# Patient Record
Sex: Female | Born: 1975 | Race: White | Hispanic: No | Smoking: Current every day smoker
Health system: Southern US, Community
[De-identification: ages and names within clinical notes are randomized; demographics above are authoritative.]

## PROBLEM LIST (undated history)

## (undated) DIAGNOSIS — M5430 Sciatica, unspecified side: Secondary | ICD-10-CM

## (undated) DIAGNOSIS — F32A Depression, unspecified: Secondary | ICD-10-CM

## (undated) DIAGNOSIS — M79604 Pain in right leg: Secondary | ICD-10-CM

## (undated) DIAGNOSIS — R350 Frequency of micturition: Secondary | ICD-10-CM

## (undated) DIAGNOSIS — N3941 Urge incontinence: Secondary | ICD-10-CM

## (undated) DIAGNOSIS — F329 Major depressive disorder, single episode, unspecified: Secondary | ICD-10-CM

## (undated) DIAGNOSIS — J353 Hypertrophy of tonsils with hypertrophy of adenoids: Secondary | ICD-10-CM

## (undated) DIAGNOSIS — G43909 Migraine, unspecified, not intractable, without status migrainosus: Secondary | ICD-10-CM

## (undated) DIAGNOSIS — J45909 Unspecified asthma, uncomplicated: Secondary | ICD-10-CM

## (undated) DIAGNOSIS — G709 Myoneural disorder, unspecified: Secondary | ICD-10-CM

## (undated) DIAGNOSIS — N3010 Interstitial cystitis (chronic) without hematuria: Secondary | ICD-10-CM

## (undated) DIAGNOSIS — Z87820 Personal history of traumatic brain injury: Secondary | ICD-10-CM

## (undated) DIAGNOSIS — G8929 Other chronic pain: Secondary | ICD-10-CM

## (undated) DIAGNOSIS — K219 Gastro-esophageal reflux disease without esophagitis: Secondary | ICD-10-CM

## (undated) DIAGNOSIS — R319 Hematuria, unspecified: Secondary | ICD-10-CM

## (undated) DIAGNOSIS — F419 Anxiety disorder, unspecified: Secondary | ICD-10-CM

## (undated) HISTORY — DX: Sciatica, unspecified side: M54.30

## (undated) HISTORY — DX: Interstitial cystitis (chronic) without hematuria: N30.10

## (undated) HISTORY — DX: Pain in right leg: M79.604

## (undated) HISTORY — PX: TUBAL LIGATION: SHX77

---

## 2004-06-19 HISTORY — PX: LAPAROSCOPIC CHOLECYSTECTOMY: SUR755

## 2006-11-18 HISTORY — PX: VAGINAL HYSTERECTOMY: SUR661

## 2007-04-20 HISTORY — PX: INTERSTIM IMPLANT PLACEMENT: SHX5130

## 2011-10-31 ENCOUNTER — Ambulatory Visit (INDEPENDENT_AMBULATORY_CARE_PROVIDER_SITE_OTHER): Payer: Medicaid Other | Admitting: Urology

## 2011-10-31 DIAGNOSIS — N3010 Interstitial cystitis (chronic) without hematuria: Secondary | ICD-10-CM

## 2011-10-31 DIAGNOSIS — R350 Frequency of micturition: Secondary | ICD-10-CM

## 2011-10-31 DIAGNOSIS — N949 Unspecified condition associated with female genital organs and menstrual cycle: Secondary | ICD-10-CM

## 2012-09-30 HISTORY — PX: OTHER SURGICAL HISTORY: SHX169

## 2012-12-02 ENCOUNTER — Other Ambulatory Visit: Payer: Self-pay | Admitting: Urology

## 2013-01-14 ENCOUNTER — Encounter (HOSPITAL_BASED_OUTPATIENT_CLINIC_OR_DEPARTMENT_OTHER): Payer: Self-pay | Admitting: *Deleted

## 2013-01-15 ENCOUNTER — Encounter (HOSPITAL_BASED_OUTPATIENT_CLINIC_OR_DEPARTMENT_OTHER): Payer: Self-pay | Admitting: *Deleted

## 2013-01-15 NOTE — Progress Notes (Signed)
NPO AFTER MN. ARRIVES AT 0800. NEEDS HG. WILL TAKE TOPAMAX AND PREVACID AM OF SURG W/ SIP OF WATER.

## 2013-01-20 NOTE — H&P (Signed)
History of Present Illness   [name] had an Interstim approximately 6 years ago in [place]. She used to get a lot of suprapubic burning and frequency. When [place] trouble shot the device in July 2013, she was much better. VESIcare had almost stopped her from voiding at one point in time. When [place] saw her last year, she had 1 or 2 years of battery life left.   In the last month, she started having a lot of burning in the suprapubic area. It is refractory to over-the-counter medications. She said she feels ____ in her bladder. It is not relieved by voiding. She does not report urinary tract infection, kidney stones, or hematuria.  There is no other modifying factors or associated signs or symptoms. There is no other aggravating or relieving factors. The symptoms are moderate in severity and persisting.   On physical examination, she is quite obese. I can palpate the Interstim II in the right high buttock area.  Review of Systems: No change in bowel or neurologic systems.   She says this is exactly the pain when her device does not work. She says she no longer can feel it in the vaginal area when she turns it up.    Past Medical History Problems  1. History of  Anxiety (Symptom) 300.00 2. History of  Asthma 493.90 3. History of  Chronic Reflux Esophagitis 530.11 4. History of  Cystitis 595.9 5. History of  Depression 311  Surgical History Problems  1. History of  Cholecystectomy Laparoscopic 2. History of  Electronic Bladder Stimulator 3. History of  Hysterectomy V45.77 4. History of  Tubal Ligation V25.2  Current Meds 1. Hydrocodone-Acetaminophen TABS; Therapy: (Recorded:13Jun2014) to 2. Omeprazole 40 MG Oral Capsule Delayed Release; Therapy: (Recorded:14May2013) to 3. Topiramate TABS; Therapy: (Recorded:14May2013) to 4. Zoloft TABS; Therapy: (Recorded:13Jun2014) to  Allergies Medication  1. Keflex CAPS 2. PROzac CAPS  Family History Problems  1. Family history of   Diabetes Mellitus V18.0 2. Family history of  Endometriosis 3. Family history of  Family Health Status Number Of Children 2 sons 4. Family history of  Nephrolithiasis 5. Family history of  Prostate Cancer V16.42  Social History Problems  1. Former Smoker V15.82 Smoked 1 pk p/d for 21 yrs 2. Marital History - Divorced V61.03 3. Never Drank Alcohol 4. Occupation: Lawyer Vital Signs [Data Includes: Last 1 Day]  13Jun2014 10:11AM  Blood Pressure: 158 / 123 Temperature: 98.4 F Heart Rate: 70  Results/Data Urine [Data Includes: Last 1 Day]   13Jun2014  COLOR ORANGE   APPEARANCE CLOUDY   SPECIFIC GRAVITY 1.020   pH 5.5   GLUCOSE 100 mg/dL  BILIRUBIN NEG   KETONE NEG mg/dL  BLOOD NEG   PROTEIN TRACE mg/dL  UROBILINOGEN 1 mg/dL  NITRITE POS   LEUKOCYTE ESTERASE NEG   SQUAMOUS EPITHELIAL/HPF FEW   WBC 0-2 WBC/hpf  RBC 0-2 RBC/hpf  BACTERIA FEW   CRYSTALS NONE SEEN   CASTS NONE SEEN   Other MUCUS NOTED    Assessment Assessed  1. 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 Abdominal Pain 789.00  Plan Health Maintenance (V70.0)  1. UA With REFLEX  Done: 13Jun2014 10:03AM  Discussion/Summary   I discussed with Pamela Leonard that the likelihood of the battery life from her Interstim is gone. I discussed replacing it and hopefully in the same position. I talked about retained fragments. I talked about the fact that in spite of replacement, we may not reach her treatment goal and she  may have ongoing discomfort. I will have Pamela Leonard trouble shoot to make certain that the battery has ran out. As long as the impedance, etc, is good, intraoperatively I do not think her lead would need to be changed. I will discuss this with [facility].  Pros, cons, success and failure rates of Interstim were discussed. We talked about the test stimulation (office/operating room) and the second stage procedure. Risks were described but not limited to the risk of persistent, de novo, or worsening  incontinence. Risks of pain, bleeding, infection, and neuropathy were discussed. Risk of malfunction, migration, and breakage were discussed. Trouble-shooting, battery life, and the need for explanation and reoperation were discussed. MRI issues were discussed. The patient understands that she might not reach her treatment goal and that she might be worse following surgery.   I thought it was reasonable for Pamela Leonard to have her Vicodin renewed at the higher strength that she is on. She will get Vancomycin preoperatively. I will have Pamela Leonard troubleshoot the day of surgery or meet her in clinic prior.  After a thorough review of the management options for the patient's condition the patient  elected to proceed with surgical therapy as noted above. We have discussed the potential benefits and risks of the procedure, side effects of the proposed treatment, the likelihood of the patient achieving the goals of the procedure, and any potential problems that might occur during the procedure or recuperation. Informed consent has been obtained.

## 2013-01-21 ENCOUNTER — Ambulatory Visit (HOSPITAL_BASED_OUTPATIENT_CLINIC_OR_DEPARTMENT_OTHER): Payer: Medicaid Other | Admitting: Anesthesiology

## 2013-01-21 ENCOUNTER — Encounter (HOSPITAL_BASED_OUTPATIENT_CLINIC_OR_DEPARTMENT_OTHER)
Admission: RE | Disposition: A | Discharge status: Home / Self Care | Payer: Self-pay | Source: Ambulatory Visit | Attending: Urology

## 2013-01-21 ENCOUNTER — Ambulatory Visit (HOSPITAL_COMMUNITY): Payer: Medicaid Other

## 2013-01-21 ENCOUNTER — Encounter (HOSPITAL_BASED_OUTPATIENT_CLINIC_OR_DEPARTMENT_OTHER): Payer: Self-pay | Admitting: Anesthesiology

## 2013-01-21 ENCOUNTER — Ambulatory Visit (HOSPITAL_BASED_OUTPATIENT_CLINIC_OR_DEPARTMENT_OTHER)
Admission: RE | Admit: 2013-01-21 | Discharge: 2013-01-21 | Disposition: A | Discharge status: Home / Self Care | Payer: Medicaid Other | Source: Ambulatory Visit | Attending: Urology | Admitting: Urology

## 2013-01-21 ENCOUNTER — Encounter (HOSPITAL_BASED_OUTPATIENT_CLINIC_OR_DEPARTMENT_OTHER): Payer: Self-pay | Admitting: *Deleted

## 2013-01-21 DIAGNOSIS — E669 Obesity, unspecified: Secondary | ICD-10-CM | POA: Insufficient documentation

## 2013-01-21 DIAGNOSIS — N3010 Interstitial cystitis (chronic) without hematuria: Secondary | ICD-10-CM | POA: Insufficient documentation

## 2013-01-21 DIAGNOSIS — K219 Gastro-esophageal reflux disease without esophagitis: Secondary | ICD-10-CM | POA: Insufficient documentation

## 2013-01-21 DIAGNOSIS — T85695A Other mechanical complication of other nervous system device, implant or graft, initial encounter: Secondary | ICD-10-CM | POA: Insufficient documentation

## 2013-01-21 DIAGNOSIS — Y831 Surgical operation with implant of artificial internal device as the cause of abnormal reaction of the patient, or of later complication, without mention of misadventure at the time of the procedure: Secondary | ICD-10-CM | POA: Insufficient documentation

## 2013-01-21 DIAGNOSIS — R109 Unspecified abdominal pain: Secondary | ICD-10-CM | POA: Insufficient documentation

## 2013-01-21 DIAGNOSIS — Z79899 Other long term (current) drug therapy: Secondary | ICD-10-CM | POA: Insufficient documentation

## 2013-01-21 HISTORY — DX: Urge incontinence: N39.41

## 2013-01-21 HISTORY — PX: INTERSTIM IMPLANT REVISION: SHX5138

## 2013-01-21 HISTORY — DX: Gastro-esophageal reflux disease without esophagitis: K21.9

## 2013-01-21 HISTORY — DX: Frequency of micturition: R35.0

## 2013-01-21 HISTORY — DX: Personal history of traumatic brain injury: Z87.820

## 2013-01-21 HISTORY — DX: Hematuria, unspecified: R31.9

## 2013-01-21 HISTORY — DX: Other chronic pain: G89.29

## 2013-01-21 HISTORY — DX: Migraine, unspecified, not intractable, without status migrainosus: G43.909

## 2013-01-21 LAB — POCT HEMOGLOBIN-HEMACUE: Hemoglobin: 12.4 g/dL (ref 12.0–15.0)

## 2013-01-21 SURGERY — REVISION, SACRAL NERVE STIMULATOR, INTERSTIM
Anesthesia: Monitor Anesthesia Care | Site: Back | Laterality: Right | Wound class: Clean

## 2013-01-21 MED ORDER — PROMETHAZINE HCL 25 MG/ML IJ SOLN
6.2500 mg | INTRAMUSCULAR | Status: DC | PRN
  Filled 2013-01-21: qty 1

## 2013-01-21 MED ORDER — LIDOCAINE-EPINEPHRINE (PF) 1 %-1:200000 IJ SOLN
INTRAMUSCULAR | Status: DC | PRN
  Administered 2013-01-21: 24 mL

## 2013-01-21 MED ORDER — BUPIVACAINE-EPINEPHRINE 0.5% -1:200000 IJ SOLN
INTRAMUSCULAR | Status: DC | PRN
  Administered 2013-01-21: 24 mL

## 2013-01-21 MED ORDER — PROPOFOL 10 MG/ML IV BOLUS
INTRAVENOUS | Status: DC | PRN
  Administered 2013-01-21: 20 mg via INTRAVENOUS
  Administered 2013-01-21: 10 mg via INTRAVENOUS

## 2013-01-21 MED ORDER — VANCOMYCIN HCL IN DEXTROSE 1-5 GM/200ML-% IV SOLN
1000.0000 mg | INTRAVENOUS | Status: AC
  Administered 2013-01-21: 1000 mg via INTRAVENOUS
  Filled 2013-01-21: qty 200

## 2013-01-21 MED ORDER — KETOROLAC TROMETHAMINE 30 MG/ML IJ SOLN
INTRAMUSCULAR | Status: DC | PRN
  Administered 2013-01-21: 30 mg via INTRAVENOUS

## 2013-01-21 MED ORDER — MIDAZOLAM HCL 5 MG/5ML IJ SOLN
INTRAMUSCULAR | Status: DC | PRN
  Administered 2013-01-21: 1 mg via INTRAVENOUS
  Administered 2013-01-21: 2 mg via INTRAVENOUS
  Administered 2013-01-21: 1 mg via INTRAVENOUS

## 2013-01-21 MED ORDER — LACTATED RINGERS IV SOLN
INTRAVENOUS | Status: DC
  Filled 2013-01-21: qty 1000

## 2013-01-21 MED ORDER — LACTATED RINGERS IV SOLN
INTRAVENOUS | Status: DC | PRN
  Administered 2013-01-21 (×2): via INTRAVENOUS

## 2013-01-21 MED ORDER — LACTATED RINGERS IV SOLN
INTRAVENOUS | Status: DC
  Administered 2013-01-21: 09:00:00 via INTRAVENOUS
  Filled 2013-01-21: qty 1000

## 2013-01-21 MED ORDER — PROPOFOL 10 MG/ML IV EMUL
INTRAVENOUS | Status: DC | PRN
  Administered 2013-01-21: 50 ug/kg/min via INTRAVENOUS

## 2013-01-21 MED ORDER — HYDROCODONE-ACETAMINOPHEN 7.5-325 MG PO TABS
1.0000 | ORAL_TABLET | Freq: Four times a day (QID) | ORAL | Status: DC | PRN
  Administered 2013-01-21: 1 via ORAL
  Filled 2013-01-21: qty 1

## 2013-01-21 MED ORDER — DEXAMETHASONE SODIUM PHOSPHATE 4 MG/ML IJ SOLN
INTRAMUSCULAR | Status: DC | PRN
  Administered 2013-01-21: 4 mg via INTRAVENOUS

## 2013-01-21 MED ORDER — HYDROCODONE-ACETAMINOPHEN 7.5-325 MG PO TABS: 1.0000 | ORAL_TABLET | Freq: Four times a day (QID) | ORAL | Status: DC | PRN

## 2013-01-21 MED ORDER — FENTANYL CITRATE 0.05 MG/ML IJ SOLN
INTRAMUSCULAR | Status: DC | PRN
  Administered 2013-01-21: 25 ug via INTRAVENOUS
  Administered 2013-01-21 (×2): 12.5 ug via INTRAVENOUS
  Administered 2013-01-21: 25 ug via INTRAVENOUS
  Administered 2013-01-21 (×2): 12.5 ug via INTRAVENOUS

## 2013-01-21 MED ORDER — ONDANSETRON HCL 4 MG/2ML IJ SOLN
INTRAMUSCULAR | Status: DC | PRN
  Administered 2013-01-21: 4 mg via INTRAVENOUS

## 2013-01-21 MED ORDER — FENTANYL CITRATE 0.05 MG/ML IJ SOLN
25.0000 ug | INTRAMUSCULAR | Status: DC | PRN
  Filled 2013-01-21: qty 1

## 2013-01-21 MED ORDER — KETAMINE HCL 10 MG/ML IJ SOLN
INTRAMUSCULAR | Status: DC | PRN
  Administered 2013-01-21 (×3): 20 mg via INTRAVENOUS
  Administered 2013-01-21: 10 mg via INTRAVENOUS
  Administered 2013-01-21: 20 mg via INTRAVENOUS
  Administered 2013-01-21: 10 mg via INTRAVENOUS
  Administered 2013-01-21 (×2): 20 mg via INTRAVENOUS
  Administered 2013-01-21: 10 mg via INTRAVENOUS

## 2013-01-21 SURGICAL SUPPLY — 60 items
BAG URINE DRAINAGE (UROLOGICAL SUPPLIES) ×3 IMPLANT
BAG URINE LEG 500ML (DRAIN) IMPLANT
BANDAGE ADHESIVE 1X3 (GAUZE/BANDAGES/DRESSINGS) IMPLANT
BENZOIN TINCTURE PRP APPL 2/3 (GAUZE/BANDAGES/DRESSINGS) ×3 IMPLANT
BLADE HEX COATED 2.75 (ELECTRODE) ×3 IMPLANT
BLADE SURG 15 STRL LF DISP TIS (BLADE) ×2 IMPLANT
BLADE SURG 15 STRL SS (BLADE) ×1
CATH FOLEY 2WAY  5CC 16FR SIL (CATHETERS) ×1
CATH FOLEY 2WAY 5CC 16FR SIL (CATHETERS) ×2 IMPLANT
CATH FOLEY 2WAY SLVR  5CC 16FR (CATHETERS)
CATH FOLEY 2WAY SLVR 5CC 16FR (CATHETERS) IMPLANT
CLOTH BEACON ORANGE TIMEOUT ST (SAFETY) ×3 IMPLANT
COVER LIGHT HANDLE  1/PK (MISCELLANEOUS) ×1
COVER LIGHT HANDLE 1/PK (MISCELLANEOUS) ×2 IMPLANT
COVER MAYO STAND STRL (DRAPES) ×3 IMPLANT
COVER PROBE W GEL 5X96 (DRAPES) ×3 IMPLANT
COVER TABLE BACK 60X90 (DRAPES) ×3 IMPLANT
DERMABOND ADVANCED (GAUZE/BANDAGES/DRESSINGS) ×2
DERMABOND ADVANCED .7 DNX12 (GAUZE/BANDAGES/DRESSINGS) ×4 IMPLANT
DRAPE C-ARM 42X72 X-RAY (DRAPES) ×3 IMPLANT
DRAPE INCISE 23X17 IOBAN STRL (DRAPES) ×1
DRAPE INCISE IOBAN 23X17 STRL (DRAPES) ×2 IMPLANT
DRAPE LAPAROSCOPIC ABDOMINAL (DRAPES) ×3 IMPLANT
DRAPE LG THREE QUARTER DISP (DRAPES) ×3 IMPLANT
DRESSING TELFA 8X3 (GAUZE/BANDAGES/DRESSINGS) ×3 IMPLANT
DRSG TEGADERM 2-3/8X2-3/4 SM (GAUZE/BANDAGES/DRESSINGS) ×3 IMPLANT
DRSG TEGADERM 4X4.75 (GAUZE/BANDAGES/DRESSINGS) ×3 IMPLANT
ELECT REM PT RETURN 9FT ADLT (ELECTROSURGICAL) ×3
ELECTRODE REM PT RTRN 9FT ADLT (ELECTROSURGICAL) ×2 IMPLANT
GAUZE SPONGE 4X4 12PLY STRL LF (GAUZE/BANDAGES/DRESSINGS) IMPLANT
GLOVE BIO SURGEON STRL SZ7.5 (GLOVE) ×3 IMPLANT
GLOVE INDICATOR 6.5 STRL GRN (GLOVE) ×3 IMPLANT
GOWN PREVENTION PLUS LG XLONG (DISPOSABLE) ×3 IMPLANT
GOWN STRL REIN XL XLG (GOWN DISPOSABLE) ×3 IMPLANT
HOLDER FOLEY CATH W/STRAP (MISCELLANEOUS) ×3 IMPLANT
INTRODUCER GUIDE DILATR SHEATH (SET/KITS/TRAYS/PACK) ×3 IMPLANT
NEEDLE FORAMEN 20GA 3.5  9CM (NEEDLE) IMPLANT
NEEDLE FORAMEN 20GA 5  12.5CM (NEEDLE) IMPLANT
NEEDLE HYPO 22GX1.5 SAFETY (NEEDLE) ×3 IMPLANT
NEEDLE SPNL 22GX7 QUINCKE BK (NEEDLE) ×3 IMPLANT
PACK BASIN DAY SURGERY FS (CUSTOM PROCEDURE TRAY) ×3 IMPLANT
PENCIL BUTTON HOLSTER BLD 10FT (ELECTRODE) IMPLANT
PROGRAMMER ANTENNA EXT (UROLOGICAL SUPPLIES) ×3 IMPLANT
PROGRAMMER STIMUL 2.2X1.1X3.7 (UROLOGICAL SUPPLIES) ×3 IMPLANT
STAPLER VISISTAT 35W (STAPLE) IMPLANT
STIMULATOR INTERSTIM 2X1.7X.3 (Orthopedic Implant) ×3 IMPLANT
STRIP CLOSURE SKIN 1/2X4 (GAUZE/BANDAGES/DRESSINGS) ×3 IMPLANT
SUCTION FRAZIER TIP 10 FR DISP (SUCTIONS) ×3 IMPLANT
SUT SILK 2 0 (SUTURE) ×1
SUT SILK 2-0 18XBRD TIE 12 (SUTURE) ×2 IMPLANT
SUT VIC AB 3-0 SH 27 (SUTURE) ×3
SUT VIC AB 3-0 SH 27X BRD (SUTURE) ×6 IMPLANT
SUT VICRYL 4-0 PS2 18IN ABS (SUTURE) ×9 IMPLANT
SYR BULB IRRIGATION 50ML (SYRINGE) ×3 IMPLANT
SYR CONTROL 10ML LL (SYRINGE) ×3 IMPLANT
SYRINGE 10CC LL (SYRINGE) ×3 IMPLANT
TOWEL OR 17X24 6PK STRL BLUE (TOWEL DISPOSABLE) ×6 IMPLANT
TRAY DSU PREP LF (CUSTOM PROCEDURE TRAY) ×3 IMPLANT
WATER STERILE IRR 500ML POUR (IV SOLUTION) ×3 IMPLANT
YANKAUER SUCT BULB TIP NO VENT (SUCTIONS) ×3 IMPLANT
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
## 2013-01-21 NOTE — Anesthesia Procedure Notes (Signed)
Procedure Name: MAC Date/Time: 01/21/2013 9:50 AM Performed by: Jessica Priest Pre-anesthesia Checklist: Patient identified, Emergency Drugs available, Suction available, Patient being monitored and Timeout performed Oxygen Delivery Method: Simple face mask Preoxygenation: Pre-oxygenation with 100% oxygen

## 2013-01-21 NOTE — Interval H&P Note (Signed)
History and Physical Interval Note:  01/21/2013 9:16 AM  Pamela Leonard  has presented today for surgery, with the diagnosis of REFRACTORY  URGENCY INCONTINENT FREQUENCY  The various methods of treatment have been discussed with the patient and family. After consideration of risks, benefits and other options for treatment, the patient has consented to  Procedure(s): INTERSTIM IMPLANT FIRST STAGE (N/A) INTERSTIM IMPLANT SECOND STAGE (N/A) as a surgical intervention .  The patient's history has been reviewed, patient examined, no change in status, stable for surgery.  I have reviewed the patient's chart and labs.  Questions were answered to the patient's satisfaction.     Keylor Rands A

## 2013-01-21 NOTE — Transfer of Care (Signed)
Immediate Anesthesia Transfer of Care Note  Patient: Pamela Leonard  Procedure(s) Performed: Procedure(s) (LRB):  replacement of neurostimulator and revision of neurostimulator electrode (Right)  Patient Location: PACU  Anesthesia Type: MAC  Level of Consciousness: awake, alert , oriented and patient cooperative  Airway & Oxygen Therapy: Patient Spontanous Breathing and Patient connected to face mask oxygen  Post-op Assessment: Report given to PACU RN and Post -op Vital signs reviewed and stable  Post vital signs: Reviewed and stable  Complications: No apparent anesthesia complications

## 2013-01-21 NOTE — Anesthesia Postprocedure Evaluation (Signed)
Anesthesia Post Note  Patient: Pamela Leonard  Procedure(s) Performed: Procedure(s) (LRB):  replacement of neurostimulator and revision of neurostimulator electrode (Right)  Anesthesia type: MAC  Patient location: PACU  Post pain: Pain level controlled  Post assessment: Post-op Vital signs reviewed  Last Vitals:  Filed Vitals:   01/21/13 1145  BP: 107/54  Pulse: 59  Temp:   Resp: 13    Post vital signs: Reviewed  Level of consciousness: sedated  Complications: No apparent anesthesia complications

## 2013-01-21 NOTE — Op Note (Signed)
Preoperative diagnosis: Malfunctioning InterStim Postoperative diagnosis: Malfunctioning InterStim Surgery: Replacement of generator; revision of lead; impedance check and fluoroscopy Surgeon: Dr. Alfredo Martinez  The patient has the above diagnoses and consented above procedure. Her frequency and pelvic pain has been severe getting worse over time. She describes small-volume frequency. She's had a lot of pelvic pain that somewhat diffuse. Severity is moderate to severe. She was given preoperative antibiotics of vancomycin based on allergies. Skin preparation was done as usual.  She is moderately obese but her generator was very lateral. It was somewhat difficult to get to see a reach that far ashy see a. She also had a loop near the usual bend medially.  I instilled 10 cc of a lidocaine epinephrine mixture. I had marked the right lateral incision. I carried the incision down with cutting current and scalpel to enter the pseudocapsule. The generator was easily delivered. It was disconnected intact with a screwdriver.  Using fluoroscopically in the AP position the lead looked excellent through S3 with the appropriate curvature.  All 4 leads Viviann Spare excellent toe. Position 0 was between 2 and 4 and position one 2 and 3 were is low as 1. It was felt that the position of the lead could not be improved upon. I had consented the patient properly that the lead might be removed which could give equal worse or better symptom control. I truly felt that I could not better the position of today's lead. The pre-surgery impedance checks were also normal and she was feeling the stimulation in the vagina.  Recognizing it would need a second incision I felt it was prudent to eventually place the generator more medial. It was very lateral relative to the bony landmarks. I was hopeful that this may help her pain syndrome. I also hopefully wanedet to straighten the loop medially perhaps make any difference with her pain  syndrome. In order to do this I had to open the incision medially to deliver the lead to the new medial generator incision.  I marked the position medially with fluoroscopy and I could see skin dimpling with cutting on the lead. I initially made approximately 2 cm incision but had to be lengthened a 3 or 4 cm because the lead was very deep. It turns out to the lead was at least an inch below the skin. It took approximate 20-30 minutes to finally deliver it safely without dislodging it and pulling it from medial to lateral. It was tested again and all 4 positions were good  I then marked an appropriate medial high right upper buttock incision 4 cm in length. Lidocaine epinephrine mixture was utilized. I dissected down forming a pocket approximately 1-2 cm in depth. I could finger dissect between the medial and lateral incision and using a modified passer passed the lead from medial to lateral. It was then connected it with a screwdriver to the IPG  Impedance check was performed. Impedance was normal with all 4 positions  X-ray was utilized again and is very pleased with the position of the lead and position of the generator.  All incisions were irrigated with sterile water. All incisions were closed with 3-0 subcuticular Vicryl area to lateral incisions were closed with 4-0 subcuticular and medial with interrupted 4-0 Vicryl. My usual sterile dressing the Dermabond was applied.  I was very pleased with the position of the generator and lead at the end of the case. The [employer] representative thought he could work with that postoperatively well hopefully to improve  her frequency. She's had improvement in pain in the past though was not placed for this.  Preoperatively the patient understood that her initial cystitis condition could have worsened for unknown reasons.  The patient will be followed as per protocol

## 2013-01-22 ENCOUNTER — Encounter (HOSPITAL_BASED_OUTPATIENT_CLINIC_OR_DEPARTMENT_OTHER): Payer: Self-pay | Admitting: Urology

## 2015-06-16 NOTE — Progress Notes (Unsigned)
B/P: 103/74, Pulse: 60, Taken by Samule Dry, RN

## 2015-12-02 ENCOUNTER — Ambulatory Visit (INDEPENDENT_AMBULATORY_CARE_PROVIDER_SITE_OTHER): Payer: Medicaid Other | Admitting: Otolaryngology

## 2016-01-06 ENCOUNTER — Ambulatory Visit (INDEPENDENT_AMBULATORY_CARE_PROVIDER_SITE_OTHER): Payer: BLUE CROSS/BLUE SHIELD | Admitting: Otolaryngology

## 2016-01-06 ENCOUNTER — Other Ambulatory Visit: Payer: Self-pay | Admitting: Otolaryngology

## 2016-01-06 DIAGNOSIS — J3501 Chronic tonsillitis: Secondary | ICD-10-CM | POA: Diagnosis not present

## 2016-01-06 DIAGNOSIS — J351 Hypertrophy of tonsils: Secondary | ICD-10-CM

## 2016-01-07 ENCOUNTER — Encounter (HOSPITAL_BASED_OUTPATIENT_CLINIC_OR_DEPARTMENT_OTHER): Payer: Self-pay | Admitting: *Deleted

## 2016-01-07 NOTE — Progress Notes (Signed)
Pre-op call completed, Patient BMI 46.1 with stated not actual weight. Will need anesthesia consult DOS.

## 2016-01-10 ENCOUNTER — Ambulatory Visit (HOSPITAL_BASED_OUTPATIENT_CLINIC_OR_DEPARTMENT_OTHER): Payer: BLUE CROSS/BLUE SHIELD | Admitting: Certified Registered"

## 2016-01-10 ENCOUNTER — Encounter (HOSPITAL_BASED_OUTPATIENT_CLINIC_OR_DEPARTMENT_OTHER): Payer: Self-pay

## 2016-01-10 ENCOUNTER — Encounter (HOSPITAL_BASED_OUTPATIENT_CLINIC_OR_DEPARTMENT_OTHER)
Admission: RE | Disposition: A | Discharge status: Home / Self Care | Payer: Self-pay | Source: Ambulatory Visit | Attending: Otolaryngology

## 2016-01-10 ENCOUNTER — Ambulatory Visit (HOSPITAL_BASED_OUTPATIENT_CLINIC_OR_DEPARTMENT_OTHER)
Admission: RE | Admit: 2016-01-10 | Discharge: 2016-01-10 | Disposition: A | Discharge status: Home / Self Care | Payer: BLUE CROSS/BLUE SHIELD | Source: Ambulatory Visit | Attending: Otolaryngology | Admitting: Otolaryngology

## 2016-01-10 DIAGNOSIS — J353 Hypertrophy of tonsils with hypertrophy of adenoids: Secondary | ICD-10-CM | POA: Diagnosis not present

## 2016-01-10 DIAGNOSIS — J351 Hypertrophy of tonsils: Secondary | ICD-10-CM | POA: Diagnosis not present

## 2016-01-10 DIAGNOSIS — Z90710 Acquired absence of both cervix and uterus: Secondary | ICD-10-CM | POA: Diagnosis not present

## 2016-01-10 DIAGNOSIS — Z6841 Body Mass Index (BMI) 40.0 and over, adult: Secondary | ICD-10-CM | POA: Insufficient documentation

## 2016-01-10 DIAGNOSIS — Z9851 Tubal ligation status: Secondary | ICD-10-CM | POA: Diagnosis not present

## 2016-01-10 DIAGNOSIS — J45909 Unspecified asthma, uncomplicated: Secondary | ICD-10-CM | POA: Diagnosis not present

## 2016-01-10 DIAGNOSIS — J3501 Chronic tonsillitis: Secondary | ICD-10-CM | POA: Diagnosis present

## 2016-01-10 DIAGNOSIS — K219 Gastro-esophageal reflux disease without esophagitis: Secondary | ICD-10-CM | POA: Insufficient documentation

## 2016-01-10 DIAGNOSIS — F17210 Nicotine dependence, cigarettes, uncomplicated: Secondary | ICD-10-CM | POA: Insufficient documentation

## 2016-01-10 DIAGNOSIS — J3503 Chronic tonsillitis and adenoiditis: Secondary | ICD-10-CM | POA: Diagnosis not present

## 2016-01-10 HISTORY — DX: Hypertrophy of tonsils with hypertrophy of adenoids: J35.3

## 2016-01-10 HISTORY — DX: Myoneural disorder, unspecified: G70.9

## 2016-01-10 HISTORY — DX: Major depressive disorder, single episode, unspecified: F32.9

## 2016-01-10 HISTORY — DX: Depression, unspecified: F32.A

## 2016-01-10 HISTORY — DX: Interstitial cystitis (chronic) without hematuria: N30.10

## 2016-01-10 HISTORY — DX: Anxiety disorder, unspecified: F41.9

## 2016-01-10 HISTORY — DX: Unspecified asthma, uncomplicated: J45.909

## 2016-01-10 HISTORY — PX: TONSILLECTOMY AND ADENOIDECTOMY: SHX28

## 2016-01-10 SURGERY — TONSILLECTOMY AND ADENOIDECTOMY
Anesthesia: General | Site: Throat | Laterality: Bilateral

## 2016-01-10 MED ORDER — FENTANYL CITRATE (PF) 100 MCG/2ML IJ SOLN
INTRAMUSCULAR | Status: AC
  Filled 2016-01-10: qty 2

## 2016-01-10 MED ORDER — HYDROMORPHONE HCL 1 MG/ML IJ SOLN
0.2500 mg | INTRAMUSCULAR | Status: DC | PRN
  Administered 2016-01-10 (×4): 0.5 mg via INTRAVENOUS

## 2016-01-10 MED ORDER — OXYMETAZOLINE HCL 0.05 % NA SOLN
NASAL | Status: DC | PRN
Start: 2016-01-10 — End: 2016-01-10
  Administered 2016-01-10: 1 via TOPICAL

## 2016-01-10 MED ORDER — SCOPOLAMINE 1 MG/3DAYS TD PT72: 1.0000 | MEDICATED_PATCH | Freq: Once | TRANSDERMAL | Status: DC | PRN

## 2016-01-10 MED ORDER — LIDOCAINE 2% (20 MG/ML) 5 ML SYRINGE
INTRAMUSCULAR | Status: DC | PRN
  Administered 2016-01-10: 100 mg via INTRAVENOUS

## 2016-01-10 MED ORDER — OXYCODONE HCL 5 MG/5ML PO SOLN: 5.0000 mg | ORAL | 0 refills | Status: DC | PRN

## 2016-01-10 MED ORDER — OXYCODONE HCL 5 MG/5ML PO SOLN: 5.0000 mg | Freq: Once | ORAL | Status: AC | PRN

## 2016-01-10 MED ORDER — DEXAMETHASONE SODIUM PHOSPHATE 10 MG/ML IJ SOLN
INTRAMUSCULAR | Status: AC
  Filled 2016-01-10: qty 1

## 2016-01-10 MED ORDER — OXYCODONE HCL 5 MG PO TABS
5.0000 mg | ORAL_TABLET | Freq: Once | ORAL | Status: AC | PRN
  Administered 2016-01-10: 5 mg via ORAL

## 2016-01-10 MED ORDER — ONDANSETRON HCL 4 MG/2ML IJ SOLN
INTRAMUSCULAR | Status: DC | PRN
  Administered 2016-01-10: 4 mg via INTRAVENOUS

## 2016-01-10 MED ORDER — PROPOFOL 10 MG/ML IV BOLUS
INTRAVENOUS | Status: DC | PRN
  Administered 2016-01-10: 300 mg via INTRAVENOUS

## 2016-01-10 MED ORDER — GLYCOPYRROLATE 0.2 MG/ML IJ SOLN: 0.2000 mg | Freq: Once | INTRAMUSCULAR | Status: DC | PRN

## 2016-01-10 MED ORDER — GLYCOPYRROLATE 0.2 MG/ML IV SOSY
PREFILLED_SYRINGE | INTRAVENOUS | Status: DC | PRN
  Administered 2016-01-10: .2 mg via INTRAVENOUS

## 2016-01-10 MED ORDER — DEXAMETHASONE SODIUM PHOSPHATE 4 MG/ML IJ SOLN
INTRAMUSCULAR | Status: DC | PRN
  Administered 2016-01-10: 10 mg via INTRAVENOUS

## 2016-01-10 MED ORDER — MIDAZOLAM HCL 2 MG/2ML IJ SOLN
1.0000 mg | INTRAMUSCULAR | Status: DC | PRN
  Administered 2016-01-10: 2 mg via INTRAVENOUS

## 2016-01-10 MED ORDER — SODIUM CHLORIDE 0.9 % IR SOLN
Status: DC | PRN
  Administered 2016-01-10: 500 mL

## 2016-01-10 MED ORDER — OXYCODONE HCL 5 MG PO TABS
ORAL_TABLET | ORAL | Status: AC
  Filled 2016-01-10: qty 1

## 2016-01-10 MED ORDER — LACTATED RINGERS IV SOLN
INTRAVENOUS | Status: DC
  Administered 2016-01-10 (×2): via INTRAVENOUS

## 2016-01-10 MED ORDER — FENTANYL CITRATE (PF) 100 MCG/2ML IJ SOLN
50.0000 ug | INTRAMUSCULAR | Status: AC | PRN
  Administered 2016-01-10: 50 ug via INTRAVENOUS
  Administered 2016-01-10: 100 ug via INTRAVENOUS
  Administered 2016-01-10: 50 ug via INTRAVENOUS

## 2016-01-10 MED ORDER — ONDANSETRON HCL 4 MG/2ML IJ SOLN
INTRAMUSCULAR | Status: AC
  Filled 2016-01-10: qty 2

## 2016-01-10 MED ORDER — MIDAZOLAM HCL 2 MG/2ML IJ SOLN
INTRAMUSCULAR | Status: AC
  Filled 2016-01-10: qty 2

## 2016-01-10 MED ORDER — MEPERIDINE HCL 25 MG/ML IJ SOLN: 6.2500 mg | INTRAMUSCULAR | Status: DC | PRN

## 2016-01-10 MED ORDER — HYDROMORPHONE HCL 1 MG/ML IJ SOLN
INTRAMUSCULAR | Status: AC
  Filled 2016-01-10: qty 1

## 2016-01-10 MED ORDER — LIDOCAINE 2% (20 MG/ML) 5 ML SYRINGE
INTRAMUSCULAR | Status: AC
  Filled 2016-01-10: qty 5

## 2016-01-10 MED ORDER — SUCCINYLCHOLINE CHLORIDE 200 MG/10ML IV SOSY
PREFILLED_SYRINGE | INTRAVENOUS | Status: DC | PRN
  Administered 2016-01-10: 160 mg via INTRAVENOUS

## 2016-01-10 SURGICAL SUPPLY — 31 items
BANDAGE COBAN STERILE 2 (GAUZE/BANDAGES/DRESSINGS) IMPLANT
CANISTER SUCT 1200ML W/VALVE (MISCELLANEOUS) ×3 IMPLANT
CATH ROBINSON RED A/P 10FR (CATHETERS) IMPLANT
CATH ROBINSON RED A/P 14FR (CATHETERS) ×3 IMPLANT
COAGULATOR SUCT 6 FR SWTCH (ELECTROSURGICAL)
COAGULATOR SUCT SWTCH 10FR 6 (ELECTROSURGICAL) IMPLANT
COVER MAYO STAND STRL (DRAPES) ×3 IMPLANT
ELECT REM PT RETURN 9FT ADLT (ELECTROSURGICAL) ×3
ELECT REM PT RETURN 9FT PED (ELECTROSURGICAL)
ELECTRODE REM PT RETRN 9FT PED (ELECTROSURGICAL) IMPLANT
ELECTRODE REM PT RTRN 9FT ADLT (ELECTROSURGICAL) ×1 IMPLANT
GLOVE BIO SURGEON STRL SZ7.5 (GLOVE) ×3 IMPLANT
GLOVE SURG SS PI 7.0 STRL IVOR (GLOVE) ×3 IMPLANT
GOWN STRL REUS W/ TWL LRG LVL3 (GOWN DISPOSABLE) ×2 IMPLANT
GOWN STRL REUS W/TWL LRG LVL3 (GOWN DISPOSABLE) ×4
IV NS 500ML (IV SOLUTION) ×2
IV NS 500ML BAXH (IV SOLUTION) ×1 IMPLANT
MARKER SKIN DUAL TIP RULER LAB (MISCELLANEOUS) IMPLANT
NS IRRIG 1000ML POUR BTL (IV SOLUTION) ×3 IMPLANT
SHEET MEDIUM DRAPE 40X70 STRL (DRAPES) ×3 IMPLANT
SOLUTION BUTLER CLEAR DIP (MISCELLANEOUS) ×3 IMPLANT
SPONGE GAUZE 4X4 12PLY STER LF (GAUZE/BANDAGES/DRESSINGS) ×3 IMPLANT
SPONGE TONSIL 1 RF SGL (DISPOSABLE) IMPLANT
SPONGE TONSIL 1.25 RF SGL STRG (GAUZE/BANDAGES/DRESSINGS) IMPLANT
SYR BULB 3OZ (MISCELLANEOUS) IMPLANT
TOWEL OR 17X24 6PK STRL BLUE (TOWEL DISPOSABLE) ×3 IMPLANT
TUBE CONNECTING 20'X1/4 (TUBING) ×1
TUBE CONNECTING 20X1/4 (TUBING) ×2 IMPLANT
TUBE SALEM SUMP 12R W/ARV (TUBING) IMPLANT
TUBE SALEM SUMP 16 FR W/ARV (TUBING) ×3 IMPLANT
WAND COBLATOR 70 EVAC XTRA (SURGICAL WAND) ×3 IMPLANT

## 2016-01-10 NOTE — H&P (Signed)
Cc: Recurrent tonsillitis, sore throaty  HPI: The patient is a 40 y/o female who presents today with her mother. The patient is seen in consultation requested by [place]. According to the patient, she has been experiencing tonsillitis most of her life. She is having recurrent strep infections which her PCP is concerned are affecting her overall health. The patient has been treated with multiple antibiotics with recurrent symptoms. She is currently complaining of a sore throat and discomfort swallowing. No previous ENT surgery is noted.   The patient's review of systems (constitutional, eyes, ENT, cardiovascular, respiratory, GI, musculoskeletal, skin, neurologic, psychiatric, endocrine, hematologic, allergic) is noted in the ROS questionnaire.  It is reviewed with the patient.   Family health history: Diabetes.   Major events: Right arm surgery/plates, electronic bladder stimulator, tubal ligation, gallbladder removed, hysterectomy .   Ongoing medical problems: Headache, migraine, circulation problems, reflux.   Social history: The patient is single. She smokes cigarettes daily. She denies the use of alcohol or illegal drugs.  Exam: General: Communicates without difficulty, well nourished, no acute distress. Head:  Normocephalic, no lesions or asymmetry. Eyes: PERRL, EOMI. No scleral icterus, conjunctivae clear.  Neuro: CN II exam reveals vision grossly intact.  No nystagmus at any point of gaze. Ears:  EAC normal without erythema AU.  TM intact without fluid and mobile AU. Nose: Moist, pink mucosa without lesions or mass. Mouth: Oral cavity clear and moist, no lesions, tonsils symmetric. Tonsils are 3+. Tonsils free of erythema and exudate. Neck: Full range of motion, no lymphadenopathy or masses.   Assessment 1.  The patient's history and physical exam findings are consistent with chronic tonsillitis/pharyngitis secondary to adenotonsillar hypertrophy.  Plan  1. The  treatment options include continuing conservative observation versus adenotonsillectomy.  Based on the patient's history and physical exam findings, the patient will likely benefit from having the tonsils and adenoid removed.  The risks, benefits, alternatives, and details of the procedure are reviewed with the patient and the parent.  Questions are invited and answered.  2. The patient is interested in proceeding with the procedure.  We will schedule the procedure in accordance with the family schedule.

## 2016-01-10 NOTE — Discharge Instructions (Addendum)
Post Anesthesia Home Care Instructions  Activity: Get plenty of rest for the remainder of the day. A responsible adult should stay with you for 24 hours following the procedure.  For the next 24 hours, DO NOT: -Drive a car -Paediatric nurse -Drink alcoholic beverages -Take any medication unless instructed by your physician -Make any legal decisions or sign important papers.  Meals: Start with liquid foods such as gelatin or soup. Progress to regular foods as tolerated. Avoid greasy, spicy, heavy foods. If nausea and/or vomiting occur, drink only clear liquids until the nausea and/or vomiting subsides. Call your physician if vomiting continues.  Special Instructions/Symptoms: Your throat may feel dry or sore from the anesthesia or the breathing tube placed in your throat during surgery. If this causes discomfort, gargle with warm salt water. The discomfort should disappear within 24 hours.  If you had a scopolamine patch placed behind your ear for the management of post- operative nausea and/or vomiting:  1. The medication in the patch is effective for 72 hours, after which it should be removed.  Wrap patch in a tissue and discard in the trash. Wash hands thoroughly with soap and water. 2. You may remove the patch earlier than 72 hours if you experience unpleasant side effects which may include dry mouth, dizziness or visual disturbances. 3. Avoid touching the patch. Wash your hands with soap and water after contact with the patch.   [facility] M[facility] Postoperative Instructions for Tonsillectomy & Adenoidectomy (T&A) Activity Restrict activity at home for the first two days, resting as much as possible. Light indoor activity is best. You may usually return to school or work within a week but void strenuous activity and sports for two weeks. Sleep with your head elevated on 2-3 pillows for 3-4 days to help decrease swelling. Diet Due to tissue swelling and throat discomfort, you  may have little desire to drink for several days. However fluids are very important to prevent dehydration. You will find that non-acidic juices, soups, popsicles, Jell-O, custard, puddings, and any soft or mashed foods taken in small quantities can be swallowed fairly easily. Try to increase your fluid and food intake as the discomfort subsides. It is recommended that a child receive 1-1/2 quarts of fluid in a 24-hour period. Adult require twice this amount.  Discomfort Your sore throat may be relieved by applying an ice collar to your neck and/or by taking Tylenol. You may experience an earache, which is due to referred pain from the throat. Referred ear pain is commonly felt at night when trying to rest.  Bleeding                        Although rare, there is risk of having some bleeding during the first 2 weeks after having a T&A. This usually happens between days 7-10 postoperatively. If you or your child should have any bleeding, try to remain calm. We recommend sitting up quietly in a chair and gently spitting out the blood into a bowl. For adults, gargling gently with ice water may help. If the bleeding does not stop after a short time (5 minutes), is more than 1 teaspoonful, or if you become worried, please call our office at [phone] or go directly to the nearest hospital emergency room. Do not eat or drink anything prior to going to the hospital as you may need to be taken to the operating room in order to control the bleeding. GENERAL CONSIDERATIONS  1. Brush your teeth regularly. Avoid mouthwashes and gargles for three weeks. You may gargle gently with warm salt-water as necessary or spray with Chloraseptic®. You may make salt-water by placing 2 teaspoons of table salt into a quart of fresh water. Warm the salt-water in a microwave to a luke warm temperature.  °2. Avoid exposure to colds and upper respiratory infections if possible.  °3. If you look into a mirror or into your child's mouth,  you will see white-gray patches in the back of the throat. This is normal after having a T&A and is like a scab that forms on the skin after an abrasion. It will disappear once the back of the throat heals completely. However, it may cause a noticeable odor; this too will disappear with time. Again, warm salt-water gargles may be used to help keep the throat clean and promote healing.  °4. You may notice a temporary change in voice quality, such as a higher pitched voice or a nasal sound, until healing is complete. This may last for 1-2 weeks and should resolve.  °5. Do not take or give you child any medications that we have not prescribed or recommended.  °6. Snoring may occur, especially at night, for the first week after a T&A. It is due to swelling of the soft palate and will usually resolve.  °Please call our office at [phone] if you have any questions.   °

## 2016-01-10 NOTE — Transfer of Care (Signed)
Immediate Anesthesia Transfer of Care Note  Patient: Pamela Leonard  Procedure(s) Performed: Procedure(s): TONSILLECTOMY AND ADENOIDECTOMY (Bilateral)  Patient Location: PACU  Anesthesia Type:General  Level of Consciousness: awake, sedated and patient cooperative  Airway & Oxygen Therapy: Patient Spontanous Breathing  Post-op Assessment: Report given to RN and Post -op Vital signs reviewed and stable  Post vital signs: Reviewed and stable  Last Vitals:  Vitals:   01/10/16 0827  BP: 118/78  Pulse: (!) 56  Resp: 18  Temp: 36.9 C    Last Pain:  Vitals:   01/10/16 0827  TempSrc: Oral  PainSc: 8       Patients Stated Pain Goal: 3 ([id number])  Complications: No apparent anesthesia complications

## 2016-01-10 NOTE — Anesthesia Preprocedure Evaluation (Signed)
Anesthesia Evaluation  Patient identified by MRN, date of birth, ID band Patient awake    Reviewed: Allergy & Precautions, NPO status , Patient's Chart, lab work & pertinent test results  Airway Mallampati: I  TM Distance: >3 FB Neck ROM: Full    Dental  (+) Teeth Intact, Dental Advisory Given   Pulmonary asthma , former smoker,    breath sounds clear to auscultation       Cardiovascular  Rhythm:Regular Rate:Normal     Neuro/Psych    GI/Hepatic GERD  Medicated and Controlled,  Endo/Other  Morbid obesity  Renal/GU      Musculoskeletal   Abdominal   Peds  Hematology   Anesthesia Other Findings   Reproductive/Obstetrics                             Anesthesia Physical Anesthesia Plan  ASA: III  Anesthesia Plan: General   Post-op Pain Management:    Induction: Intravenous  Airway Management Planned: Oral ETT  Additional Equipment:   Intra-op Plan:   Post-operative Plan: Extubation in OR  Informed Consent: I have reviewed the patients History and Physical, chart, labs and discussed the procedure including the risks, benefits and alternatives for the proposed anesthesia with the patient or authorized representative who has indicated his/her understanding and acceptance.   Dental advisory given  Plan Discussed with: CRNA, Anesthesiologist and Surgeon  Anesthesia Plan Comments:         Anesthesia Quick Evaluation

## 2016-01-10 NOTE — Anesthesia Procedure Notes (Signed)
Procedure Name: Intubation Date/Time: 01/10/2016 11:09 AM Performed by: Lyndee Leo Pre-anesthesia Checklist: Patient identified, Emergency Drugs available, Suction available and Patient being monitored Patient Re-evaluated:Patient Re-evaluated prior to inductionOxygen Delivery Method: Circle system utilized Preoxygenation: Pre-oxygenation with 100% oxygen Intubation Type: IV induction Ventilation: Mask ventilation without difficulty Laryngoscope Size: Miller and 2 Grade View: Grade II Tube type: Oral Tube size: 7.0 mm Number of attempts: 1 Airway Equipment and Method: Stylet and Oral airway Placement Confirmation: ETT inserted through vocal cords under direct vision,  positive ETCO2 and breath sounds checked- equal and bilateral Secured at: 20 cm Tube secured with: Tape Dental Injury: Teeth and Oropharynx as per pre-operative assessment

## 2016-01-10 NOTE — Anesthesia Postprocedure Evaluation (Signed)
Anesthesia Post Note  Patient: Research scientist (life sciences)  Procedure(s) Performed: Procedure(s) (LRB): TONSILLECTOMY AND ADENOIDECTOMY (Bilateral)  Patient location during evaluation: PACU Anesthesia Type: General Level of consciousness: awake and alert Pain management: pain level controlled Vital Signs Assessment: post-procedure vital signs reviewed and stable Respiratory status: spontaneous breathing, nonlabored ventilation and respiratory function stable Cardiovascular status: blood pressure returned to baseline and stable Postop Assessment: no signs of nausea or vomiting Anesthetic complications: no    Last Vitals:  Vitals:   01/10/16 1230 01/10/16 1400  BP: 118/72 129/71  Pulse: 67 (!) 54  Resp: 15 18  Temp:  36.5 C    Last Pain:  Vitals:   01/10/16 1400  TempSrc:   PainSc: 3                  Desaree Downen A

## 2016-01-10 NOTE — Op Note (Signed)
DATE OF PROCEDURE:  01/10/2016                              OPERATIVE REPORT  SURGEON:  Leta Baptist, MD  PREOPERATIVE DIAGNOSES: 1. Adenotonsillar hypertrophy. 2. Chronic tonsillitis and pharyngitis  POSTOPERATIVE DIAGNOSES: 1. Adenotonsillar hypertrophy. 2. Chronic tonsillitis and pharyngitis  PROCEDURE PERFORMED:  Adenotonsillectomy.  ANESTHESIA:  General endotracheal tube anesthesia.  COMPLICATIONS:  None.  ESTIMATED BLOOD LOSS:  Minimal.  INDICATION FOR PROCEDURE:  Pamela Leonard is a 40 y.o. female with a history of chronic tonsillitis/pharyngitis and halitosis.  According to the patient, she has been experiencing chronic throat discomfort for several years. The patient continues to be symptomatic despite medical treatments. On examination, the patient was noted to have bilateral cryptic tonsils, with numerous tonsilloliths. Based on the above findings, the decision was made for the patient to undergo the adenotonsillectomy procedure. Likelihood of success in reducing symptoms was also discussed.  The risks, benefits, alternatives, and details of the procedure were discussed with the mother.  Questions were invited and answered.  Informed consent was obtained.  DESCRIPTION:  The patient was taken to the operating room and placed supine on the operating table.  General endotracheal tube anesthesia was administered by the anesthesiologist.  The patient was positioned and prepped and draped in a standard fashion for adenotonsillectomy.  A Crowe-Davis mouth gag was inserted into the oral cavity for exposure. 2+ cryptic tonsils were noted bilaterally.  No bifidity was noted.  Indirect mirror examination of the nasopharynx revealed mild adenoid hypertrophy. The adenoid was ablated with the Coblator device. Hemostasis was achieved with the Coblator device.  The right tonsil was then grasped with a straight Allis clamp and retracted medially.  It was resected free from the underlying pharyngeal  constrictor muscles with the Coblator device.  The same procedure was repeated on the left side without exception.  The surgical sites were copiously irrigated.  The mouth gag was removed.  The care of the patient was turned over to the anesthesiologist.  The patient was awakened from anesthesia without difficulty.  The patient was extubated and transferred to the recovery room in good condition.  OPERATIVE FINDINGS:  Adenotonsillar hypertrophy.  SPECIMEN:  Bilateral tonsils  FOLLOWUP CARE:  The patient will be discharged home once awake and alert.  She will be placed on oxycodone 5-72ml po q 4 hours for postop pain control.   The patient will follow up in my office in approximately 2 weeks.  Ascencion Dike 01/10/2016 11:51 AM

## 2016-01-13 ENCOUNTER — Encounter (HOSPITAL_BASED_OUTPATIENT_CLINIC_OR_DEPARTMENT_OTHER): Payer: Self-pay | Admitting: Otolaryngology

## 2016-01-24 ENCOUNTER — Ambulatory Visit (INDEPENDENT_AMBULATORY_CARE_PROVIDER_SITE_OTHER): Payer: BLUE CROSS/BLUE SHIELD | Admitting: Otolaryngology

## 2016-09-14 ENCOUNTER — Ambulatory Visit (INDEPENDENT_AMBULATORY_CARE_PROVIDER_SITE_OTHER): Payer: BLUE CROSS/BLUE SHIELD | Admitting: Orthopaedic Surgery

## 2016-09-14 ENCOUNTER — Encounter (INDEPENDENT_AMBULATORY_CARE_PROVIDER_SITE_OTHER): Payer: Self-pay | Admitting: Orthopaedic Surgery

## 2016-09-14 VITALS — BP 115/80 | HR 69 | Ht 61.0 in | Wt 252.0 lb

## 2016-09-14 DIAGNOSIS — F1120 Opioid dependence, uncomplicated: Secondary | ICD-10-CM | POA: Diagnosis not present

## 2016-09-14 DIAGNOSIS — G894 Chronic pain syndrome: Secondary | ICD-10-CM

## 2016-09-14 DIAGNOSIS — M5441 Lumbago with sciatica, right side: Secondary | ICD-10-CM | POA: Diagnosis not present

## 2016-09-14 DIAGNOSIS — G8929 Other chronic pain: Secondary | ICD-10-CM | POA: Diagnosis not present
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
## 2016-09-18 DIAGNOSIS — M5441 Lumbago with sciatica, right side: Principal | ICD-10-CM

## 2016-09-18 DIAGNOSIS — G894 Chronic pain syndrome: Secondary | ICD-10-CM | POA: Insufficient documentation

## 2016-09-18 DIAGNOSIS — F1120 Opioid dependence, uncomplicated: Secondary | ICD-10-CM | POA: Insufficient documentation

## 2016-09-18 DIAGNOSIS — G8929 Other chronic pain: Secondary | ICD-10-CM | POA: Insufficient documentation

## 2016-09-20 ENCOUNTER — Telehealth (INDEPENDENT_AMBULATORY_CARE_PROVIDER_SITE_OTHER): Payer: Self-pay | Admitting: Radiology

## 2016-09-20 ENCOUNTER — Other Ambulatory Visit (INDEPENDENT_AMBULATORY_CARE_PROVIDER_SITE_OTHER): Payer: Self-pay | Admitting: Orthopaedic Surgery

## 2016-09-20 DIAGNOSIS — M5441 Lumbago with sciatica, right side: Principal | ICD-10-CM

## 2016-09-20 DIAGNOSIS — G8929 Other chronic pain: Secondary | ICD-10-CM

## 2016-09-20 NOTE — Telephone Encounter (Signed)
Patient called Pamela Leonard office and states she has not heard from anyone regarding the scheduling of her Lumbar Myelogram. She would like for [place] to check on this.  Can you let me know and I will be glad to call patient. She has requested 4/18 afternoon. I am not sure if that is why they have not called yet. Thanks.

## 2016-09-20 NOTE — Telephone Encounter (Signed)
I called Roberta at [facility] in system, will call pt to schedule and will contact me if need to make changes.

## 2016-10-04 ENCOUNTER — Ambulatory Visit
Admission: RE | Admit: 2016-10-04 | Discharge: 2016-10-04 | Disposition: A | Discharge status: Home / Self Care | Payer: BLUE CROSS/BLUE SHIELD | Source: Ambulatory Visit | Attending: Orthopaedic Surgery | Admitting: Orthopaedic Surgery

## 2016-10-04 VITALS — BP 110/59 | HR 59

## 2016-10-04 DIAGNOSIS — M5441 Lumbago with sciatica, right side: Principal | ICD-10-CM

## 2016-10-04 DIAGNOSIS — G8929 Other chronic pain: Secondary | ICD-10-CM

## 2016-10-04 DIAGNOSIS — G894 Chronic pain syndrome: Secondary | ICD-10-CM

## 2016-10-04 MED ORDER — ONDANSETRON HCL 4 MG/2ML IJ SOLN
4.0000 mg | Freq: Once | INTRAMUSCULAR | Status: AC
  Administered 2016-10-04: 4 mg via INTRAMUSCULAR

## 2016-10-04 MED ORDER — MEPERIDINE HCL 100 MG/ML IJ SOLN
75.0000 mg | Freq: Once | INTRAMUSCULAR | Status: AC
  Administered 2016-10-04: 75 mg via INTRAMUSCULAR

## 2016-10-04 MED ORDER — IOPAMIDOL (ISOVUE-M 200) INJECTION 41%
15.0000 mL | Freq: Once | INTRAMUSCULAR | Status: AC
  Administered 2016-10-04: 15 mL via INTRATHECAL

## 2016-10-04 MED ORDER — DIAZEPAM 5 MG PO TABS
10.0000 mg | ORAL_TABLET | Freq: Once | ORAL | Status: AC
  Administered 2016-10-04: 10 mg via ORAL

## 2016-10-04 NOTE — Discharge Instructions (Signed)

## 2016-10-05 ENCOUNTER — Ambulatory Visit (INDEPENDENT_AMBULATORY_CARE_PROVIDER_SITE_OTHER): Payer: BLUE CROSS/BLUE SHIELD | Admitting: Orthopaedic Surgery

## 2016-10-05 ENCOUNTER — Encounter (INDEPENDENT_AMBULATORY_CARE_PROVIDER_SITE_OTHER): Payer: Self-pay | Admitting: Orthopaedic Surgery

## 2016-10-05 VITALS — BP 127/75 | HR 57 | Ht 61.0 in | Wt 244.0 lb

## 2016-10-05 DIAGNOSIS — S83412A Sprain of medial collateral ligament of left knee, initial encounter: Secondary | ICD-10-CM

## 2016-10-05 DIAGNOSIS — G894 Chronic pain syndrome: Secondary | ICD-10-CM

## 2016-10-12 ENCOUNTER — Telehealth (INDEPENDENT_AMBULATORY_CARE_PROVIDER_SITE_OTHER): Payer: Self-pay | Admitting: Radiology

## 2016-10-12 DIAGNOSIS — S83412A Sprain of medial collateral ligament of left knee, initial encounter: Secondary | ICD-10-CM | POA: Insufficient documentation

## 2016-10-12 NOTE — Telephone Encounter (Signed)
Patient requests out of work note until 10/19/2016 when she sees you back in the office. OK for note?

## 2016-10-12 NOTE — Telephone Encounter (Signed)
Note entered into system. Printed and put up front for patient to pick up in [place] office.
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
## 2016-10-12 NOTE — Telephone Encounter (Signed)
OK thanks. Had knee sprain of MCL thanks

## 2016-10-19 ENCOUNTER — Ambulatory Visit (INDEPENDENT_AMBULATORY_CARE_PROVIDER_SITE_OTHER): Payer: BLUE CROSS/BLUE SHIELD | Admitting: Orthopaedic Surgery

## 2016-10-19 ENCOUNTER — Encounter (INDEPENDENT_AMBULATORY_CARE_PROVIDER_SITE_OTHER): Payer: Self-pay | Admitting: Orthopaedic Surgery

## 2016-10-19 VITALS — BP 110/71 | HR 69 | Ht 61.0 in | Wt 244.0 lb

## 2016-10-19 DIAGNOSIS — S83412A Sprain of medial collateral ligament of left knee, initial encounter: Secondary | ICD-10-CM | POA: Diagnosis not present

## 2016-10-19 DIAGNOSIS — M545 Low back pain, unspecified: Secondary | ICD-10-CM
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
## 2016-10-31 ENCOUNTER — Telehealth (INDEPENDENT_AMBULATORY_CARE_PROVIDER_SITE_OTHER): Payer: Self-pay | Admitting: Orthopaedic Surgery

## 2016-10-31 NOTE — Telephone Encounter (Signed)
I FAXED OV NOTES 10/05/2016 - PRESENT TO [facility] [phone]

## 2017-06-08 ENCOUNTER — Encounter: Payer: Self-pay | Admitting: Internal Medicine

## 2017-08-02 ENCOUNTER — Encounter: Payer: Self-pay | Admitting: Nurse Practitioner

## 2017-08-02 ENCOUNTER — Ambulatory Visit: Payer: BLUE CROSS/BLUE SHIELD | Admitting: Nurse Practitioner

## 2017-08-02 ENCOUNTER — Telehealth: Payer: Self-pay | Admitting: Nurse Practitioner

## 2017-08-02 NOTE — Telephone Encounter (Signed)
PATIENT WAS A NO SHOW AND LETTER SENT  °

## 2017-08-02 NOTE — Telephone Encounter (Signed)
Noted  

## 2017-08-08 ENCOUNTER — Ambulatory Visit: Payer: BLUE CROSS/BLUE SHIELD | Admitting: Neurology

## 2017-08-08 ENCOUNTER — Encounter: Payer: Self-pay | Admitting: Neurology

## 2017-08-08 ENCOUNTER — Ambulatory Visit: Payer: BLUE CROSS/BLUE SHIELD | Admitting: Diagnostic Neuroimaging

## 2017-08-08 VITALS — BP 145/78 | HR 52 | Ht 61.0 in | Wt 235.8 lb

## 2017-08-08 DIAGNOSIS — G5711 Meralgia paresthetica, right lower limb: Secondary | ICD-10-CM | POA: Insufficient documentation

## 2017-08-08 MED ORDER — OXCARBAZEPINE 150 MG PO TABS
150.0000 mg | ORAL_TABLET | Freq: Two times a day (BID) | ORAL | 11 refills | Status: DC
Start: 2017-08-08 — End: 2021-12-27
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
## 2017-08-09 ENCOUNTER — Telehealth: Payer: Self-pay | Admitting: Neurology

## 2017-08-09 LAB — SEDIMENTATION RATE: Sed Rate: 30 mm/hr (ref 0–32)

## 2017-08-09 LAB — C-REACTIVE PROTEIN: CRP: 28 mg/L — ABNORMAL HIGH (ref 0.0–4.9)

## 2017-08-09 LAB — HGB A1C W/O EAG: Hgb A1c MFr Bld: 5.5 % (ref 4.8–5.6)

## 2017-08-09 LAB — ANA W/REFLEX: Anti Nuclear Antibody(ANA): POSITIVE — AB

## 2017-08-09 LAB — ENA+DNA/DS+SJORGEN'S
ENA SM Ab Ser-aCnc: <0.2 AI (ref 0.0–0.9)
ENA SSA (RO) Ab: <0.2 AI (ref 0.0–0.9)
ENA SSB (LA) Ab: <0.2 AI (ref 0.0–0.9)
dsDNA Ab: 1 IU/mL (ref 0–9)

## 2017-08-09 LAB — RPR: RPR: NONREACTIVE

## 2017-08-09 LAB — CK: Total CK: 71 U/L (ref 24–173)

## 2017-08-09 LAB — VITAMIN D 25 HYDROXY (VIT D DEFICIENCY, FRACTURES): VIT D 25 HYDROXY: 14.8 ng/mL — AB (ref 30.0–100.0)

## 2017-08-09 LAB — VITAMIN B12: VITAMIN B 12: 592 pg/mL (ref 232–1245)

## 2017-08-09 LAB — TSH: TSH: 2.64 u[IU]/mL (ref 0.450–4.500)

## 2017-08-09 NOTE — Telephone Encounter (Signed)
Left a message requesting a return call.

## 2017-08-09 NOTE — Telephone Encounter (Signed)
Please call patient, extensive laboratory evaluation showed  1, low vitamin D level 15, she would benefit over-the-counter vitamin D3 supplement 1000 units 2 tablets every day may consider repeat lab in 6-12 months. 2, elevated C-reactive protein, positive ANA, RNP antibodies, above findings has unknown clinical significance, we may repeat that at her next follow-up visit.  Rest of the laboratory evaluations were normal

## 2017-08-09 NOTE — Telephone Encounter (Signed)
Pt returning RN's call.

## 2017-08-10 ENCOUNTER — Encounter: Payer: Self-pay | Admitting: *Deleted

## 2017-08-10 NOTE — Telephone Encounter (Addendum)
Spoke to patient - she is aware of results.  She will start the recommended supplement and keep pending appt on 08/17/17.  Says her C-reactive protein, positive ANA and RNP antibodies have been an issue for her in the past.

## 2017-08-17 ENCOUNTER — Ambulatory Visit (INDEPENDENT_AMBULATORY_CARE_PROVIDER_SITE_OTHER): Payer: BLUE CROSS/BLUE SHIELD | Admitting: Neurology

## 2017-08-17 DIAGNOSIS — G5711 Meralgia paresthetica, right lower limb: Secondary | ICD-10-CM

## 2017-08-17 DIAGNOSIS — G894 Chronic pain syndrome: Secondary | ICD-10-CM | POA: Diagnosis not present
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
## 2017-08-17 NOTE — Procedures (Signed)
Full Name: Pamela Leonard Gender: Female MRN #: [id number] Date of Birth: [birth year]    Visit Date: 08/17/17 08:20 Age: 42 Years 30 Months Old Examining Physician: Marcial Pacas, MD  Referring Physician: Krista Blue, MD History: 42 year old female, complains of 2 years history of right lateral thigh area paresthesia  Summary of the tests: Nerve conduction study:  Right peroneal, tibial motor responses were normal.  Right sural, superficial peroneal sensory responses were normal.  Bilateral lateral femoral cutaneous nerve response was not able to be elicited  Electromyography: Selective needle examinations of right lower extremity muscles were normal.  Conclusion: This is a normal study, there is no electrodiagnostic evidence of right lower extremity neuropathy or right lumbosacral radiculopathy.    ------------------------------- Marcial Pacas, M.D.  [facility] [place],  [address] Tel: [phone] Fax: [phone]        [facility]    Nerve / Sites Muscle Latency Ref. Amplitude Ref. Rel Amp Segments Distance Velocity Ref. Area    ms ms mV mV %  cm m/s m/s mVms  R Peroneal - EDB     Ankle EDB 4.1 ?6.5 6.6 ?2.0 100 Ankle - EDB 9   21.2     Fib head EDB 8.9  6.1  92.5 Fib head - Ankle 27 56 ?44 20.4     Pop fossa EDB 10.9  6.2  101 Pop fossa - Fib head 12 58 ?44 20.3         Pop fossa - Ankle      R Tibial - AH     Ankle AH 3.3 ?5.8 13.6 ?4.0 100 Ankle - AH 9   25.8     Pop fossa AH 10.5  10.8  79.3 Pop fossa - Ankle 33 46 ?41 23.5         SNC    Nerve / Sites Rec. Site Peak Lat Ref.  Amp Ref. Segments Distance    ms ms V V  cm  R Sural - Ankle (Calf)     Calf Ankle 3.2 ?4.4 9 ?6 Calf - Ankle 14  R Superficial peroneal - Ankle     Lat leg Ankle 3.1 ?4.4 12 ?6 Lat leg - Ankle 14  L Lateral femoral cutaneous - Thigh (Inguinal ligament)     A. Ing ligament Thigh NR  NR  A. Ing ligament - Thigh   R Lateral femoral cutaneous - Thigh (Inguinal  ligament)     A. Ing ligament Thigh NR  NR  A. Ing ligament - Thigh                       F  Wave    Nerve F Lat Ref.   ms ms  R Tibial - AH 41.2 ?56.0       EMG full       EMG Summary Table    Spontaneous MUAP Recruitment  Muscle IA Fib PSW Fasc Other Amp Dur. Poly Pattern  R. Vastus lateralis Normal None None None _______ Normal Normal Normal Normal  R. Tibialis anterior Normal None None None _______ Normal Normal Normal Normal  R. Tibialis posterior Normal None None None _______ Normal Normal Normal Normal  R. Peroneus longus Normal None None None _______ Normal Normal Normal Normal  R. Gastrocnemius (Medial head) Normal None None None _______ Normal Normal Normal Normal  R. Biceps femoris (short head) Normal None None None _______ Normal Normal Normal Normal  R.  Iliopsoas Normal None None None _______ Normal Normal Normal Normal  R. Gluteus medius Normal None None None _______ Normal Normal Normal Normal

## 2018-01-16 IMAGING — XA DG MYELOGRAPHY LUMBAR INJ LUMBOSACRAL
14 of 19 series · 14 of 19 positions shown · non-contrast
Comparison: None

CLINICAL DATA: Low back pain. RIGHT leg pain. Evaluate for mid to
upper lumbar foraminal or extraforaminal disc protrusion causing
thigh symptoms.
TECHNIQUE: Contiguous axial images were obtained through the Lumbar spine after
the intrathecal infusion of infusion. Coronal and sagittal
reconstructions were obtained of the axial image sets.

[Series 1: vasc adipose · 1 of 1 slices shown (1 of 12)]
[im 1/1]
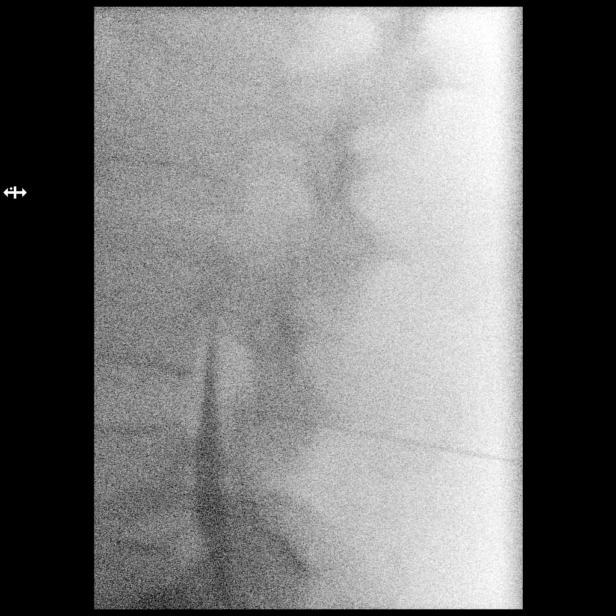

[Series 2: w lumbar spine lat · 0.15mm/px · 1 of 1 slices shown]
[im 1/1]
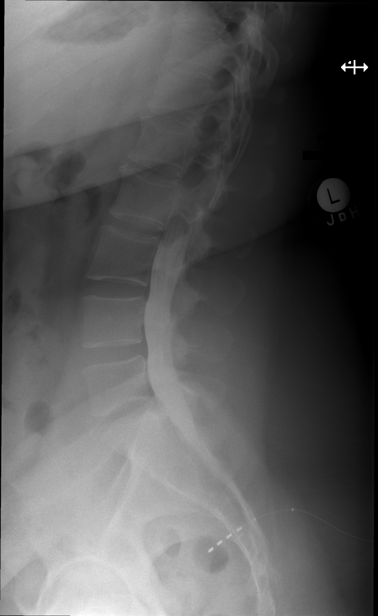

[Series 2: vasc adipose · 1 of 1 slices shown (2 of 12)]
[im 1/1]
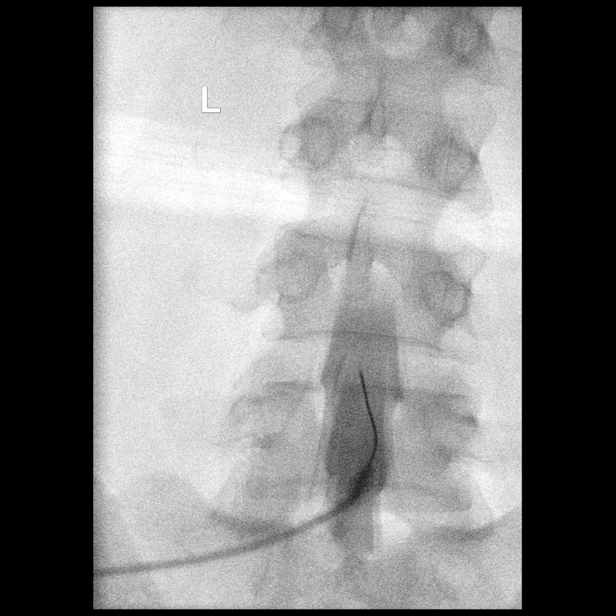

[Series 3: vasc adipose · 1 of 1 slices shown (3 of 12)]
[im 1/1]
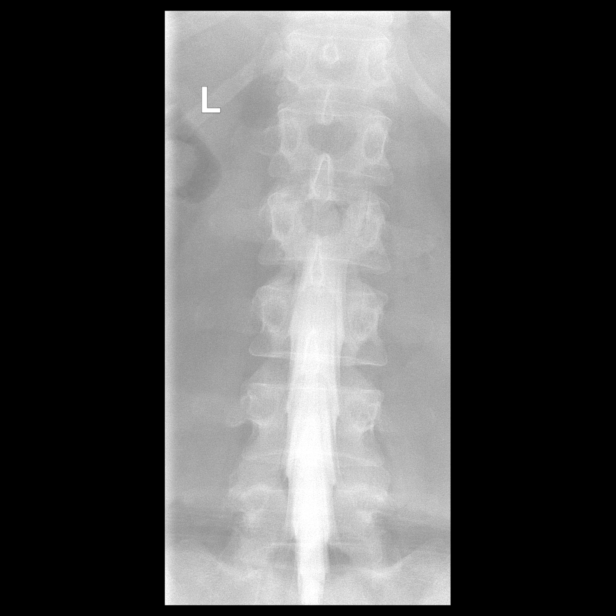

[Series 4: vasc adipose · 1 of 1 slices shown (4 of 12)]
[im 1/1]
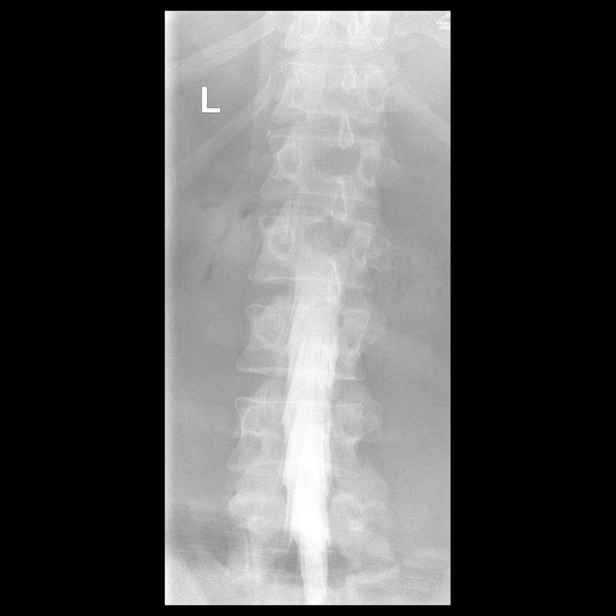

[Series 4: w lumbar spine extension · 0.15mm/px · 1 of 1 slices shown]
[im 1/1]
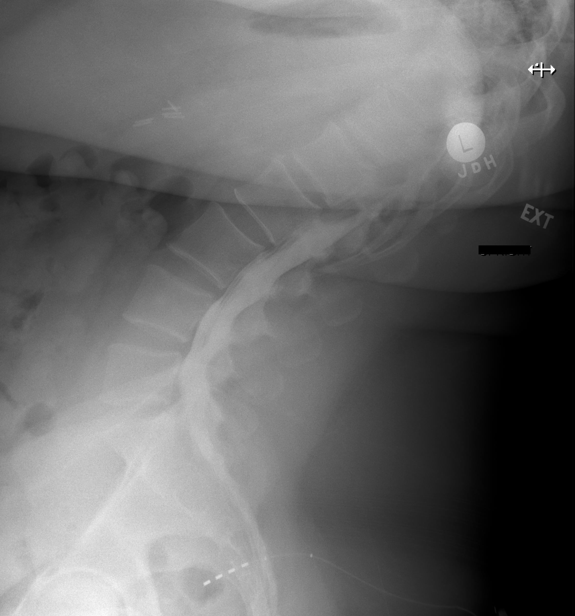

[Series 5: vasc adipose · 1 of 1 slices shown (5 of 12)]
[im 1/1]
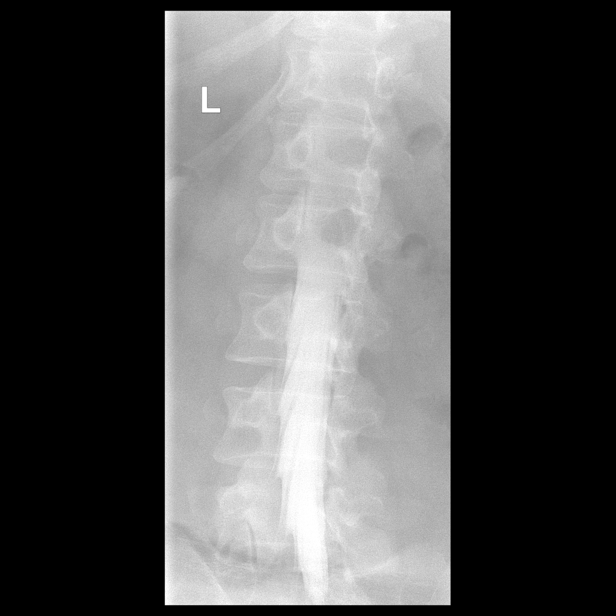

[Series 7: vasc adipose · 1 of 1 slices shown (6 of 12)]
[im 1/1]
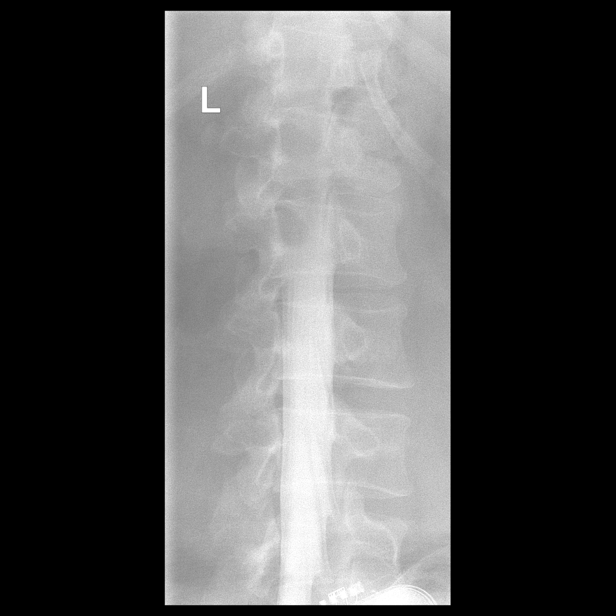

[Series 8: vasc adipose · 1 of 1 slices shown (7 of 12)]
[im 1/1]
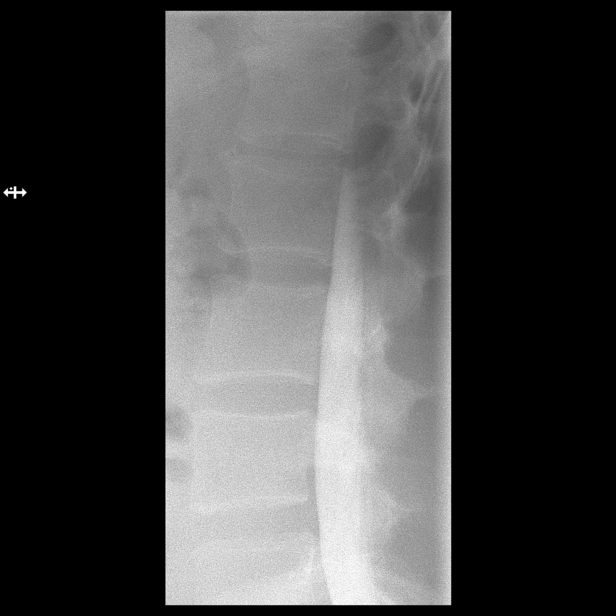

[Series 9: vasc adipose · 1 of 1 slices shown (8 of 12)]
[im 1/1]
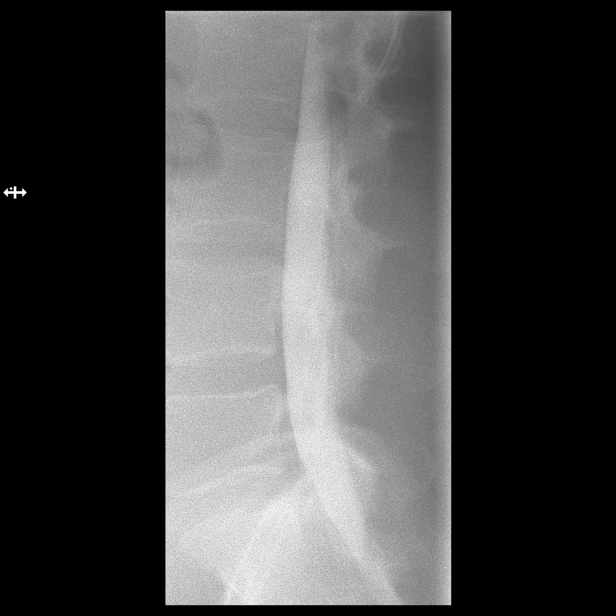

[Series 11: vasc adipose · 1 of 1 slices shown (9 of 12)]
[im 1/1]
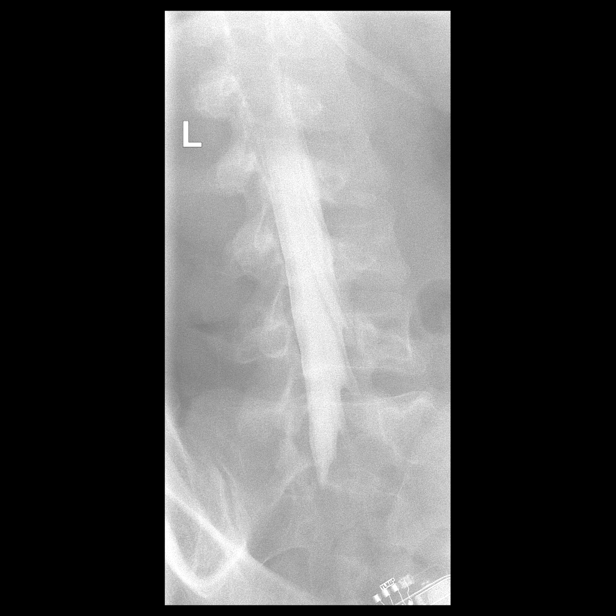

[Series 12: vasc adipose · 1 of 1 slices shown (10 of 12)]
[im 1/1]
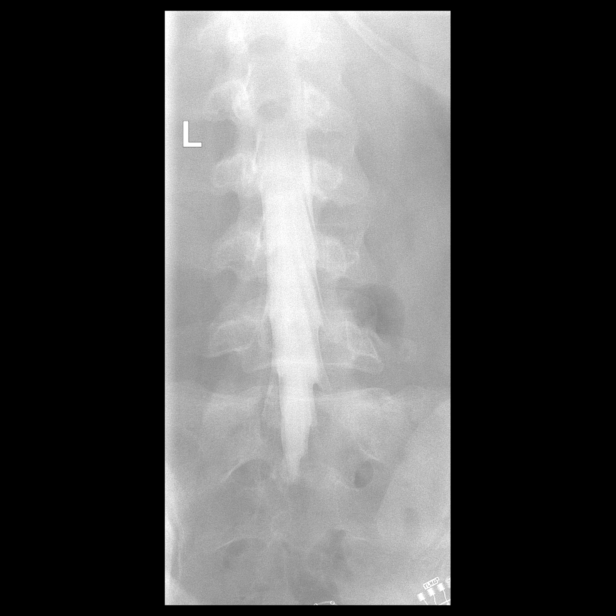

[Series 13: vasc adipose · 1 of 1 slices shown (11 of 12)]
[im 1/1]
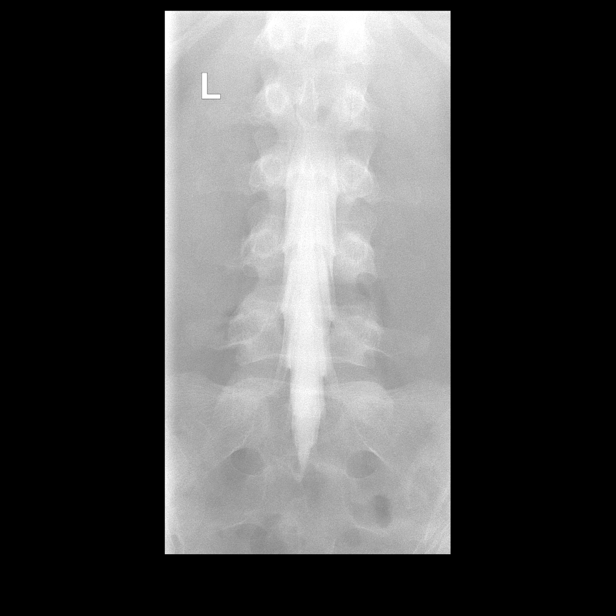

[Series 15: vasc adipose · 1 of 1 slices shown (12 of 12)]
[im 1/1]
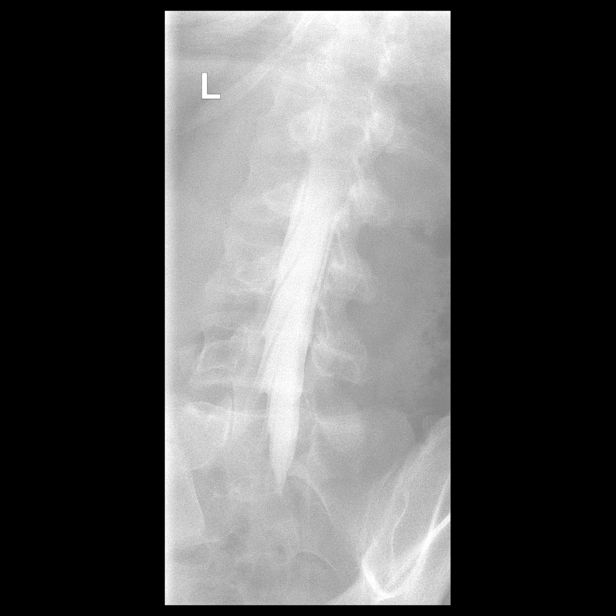

[14 of 19 positions shown; findings below may reference images not displayed]

EXAM:
LUMBAR MYELOGRAM

FLUOROSCOPY TIME:  47 seconds corresponding to a Dose Area Product
of 383.6 ?Gy*m2

PROCEDURE:
After thorough discussion of risks and benefits of the procedure
including bleeding, infection, injury to nerves, blood vessels,
adjacent structures as well as headache and CSF leak, written and
oral informed consent was obtained. Consent was obtained by Dr. Rabdau
Vilmar. Time out form was completed.

Patient was positioned prone on the fluoroscopy table. Local
anesthesia was provided with 1% lidocaine without epinephrine after
prepped and draped in the usual sterile fashion. Puncture was
performed at L4-5 using a 5 inch 22-gauge spinal needle via midline
approach. Using a single pass through the dura, the needle was
placed within the thecal sac, with return of clear CSF. 15 mL of
Isovue-M 200 was injected into the thecal sac, with normal
opacification of the nerve roots and cauda equina consistent with
free flow within the subarachnoid space.

I personally performed the lumbar puncture and administered the
intrathecal contrast. I also personally supervised acquisition of
the myelogram images.
FINDINGS: LUMBAR MYELOGRAM FINDINGS:

Good opacification lumbar subarachnoid space. No nerve root cut off
or spinal stenosis. Normal conus. Anatomic alignment.

With patient upright, anatomic alignment is maintained. There is no
dynamic instability. Incidental bladder stimulator wire through the
sacral foramina and RIGHT-sided battery pack.

CT LUMBAR MYELOGRAM FINDINGS:

Segmentation: Normal.

Alignment:  Normal.

Vertebrae: No worrisome osseous lesion.

Conus medullaris: Normal in size and echo

Paraspinal tissues: No evidence for hydronephrosis or paravertebral
mass. Early changes of aortic atherosclerosis with calcification.

Disc levels:

No disc protrusion or spinal stenosis.

Extraforaminal soft tissues were carefully examined, and there is no
evidence for RIGHT-side far-lateral protrusion or RIGHT-sided psoas
muscle lesion.
IMPRESSION: LUMBAR MYELOGRAM IMPRESSION:

Normal lumbar myelogram. Anatomic alignment without nerve root cut
off or spinal stenosis. No dynamic instability.

CT LUMBAR MYELOGRAM IMPRESSION:

Unremarkable post myelogram CT. No RIGHT-sided disc protrusion is
evident, in the subarticular zone, foraminal zone, or extraforaminal
compartment.

Aortic atherosclerosis.

## 2018-01-16 IMAGING — CT CT L SPINE W/ CM
1 of 6 series · 5 of 14 positions shown, 7 images · non-contrast
Comparison: None

CLINICAL DATA: Low back pain. RIGHT leg pain. Evaluate for mid to
upper lumbar foraminal or extraforaminal disc protrusion causing
thigh symptoms.
TECHNIQUE: Contiguous axial images were obtained through the Lumbar spine after
the intrathecal infusion of infusion. Coronal and sagittal
reconstructions were obtained of the axial image sets.

[Series 2: l spine soft · axial · 0.27mm/px · z∈[-166,-22]mm · 5 of 73 slices shown, 7 images]
[im 13/73  soft-tissue]
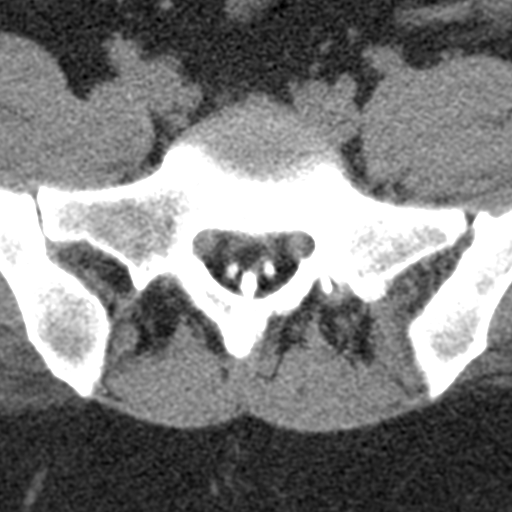
[im 13/73  bone]
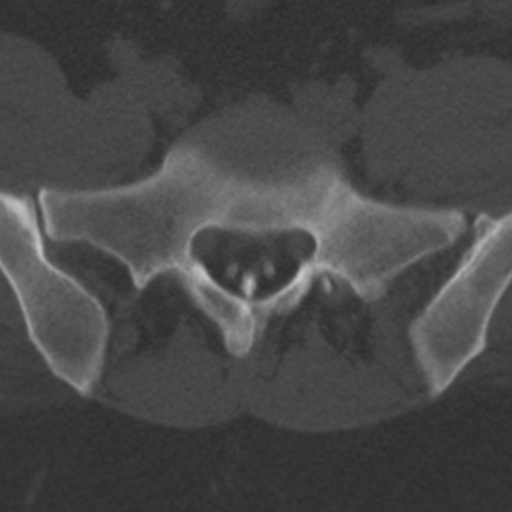
[im 25/73  bone]
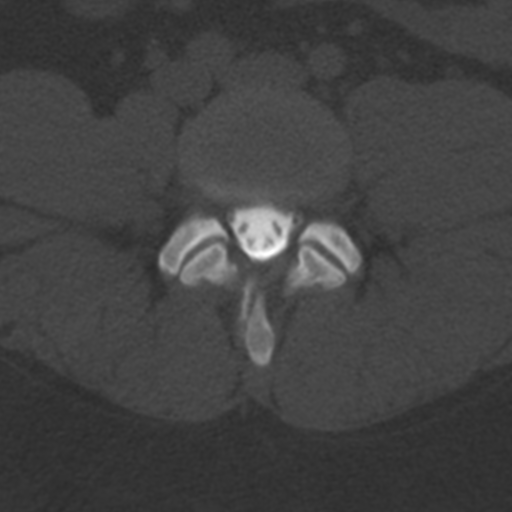
[im 37/73  bone]
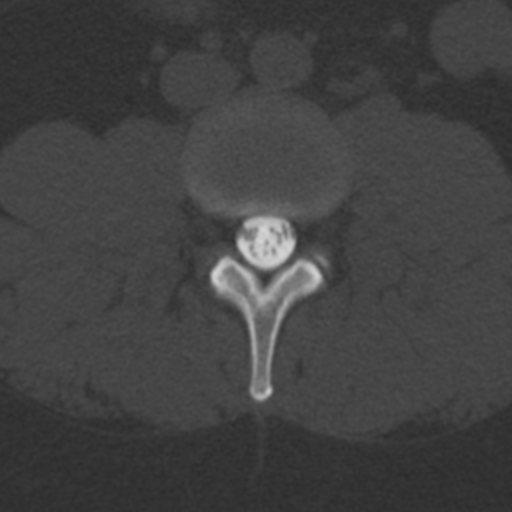
[im 49/73  bone]
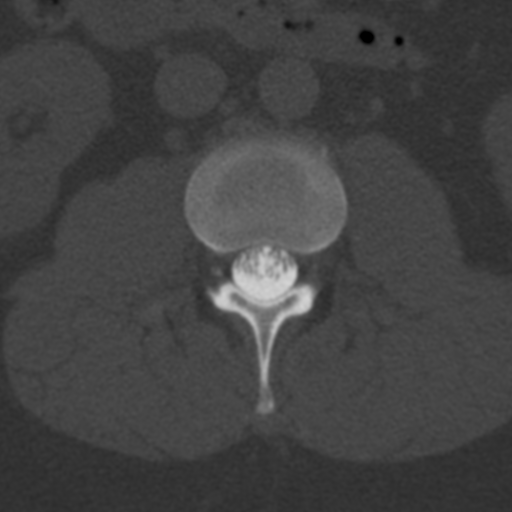
[im 61/73  soft-tissue]
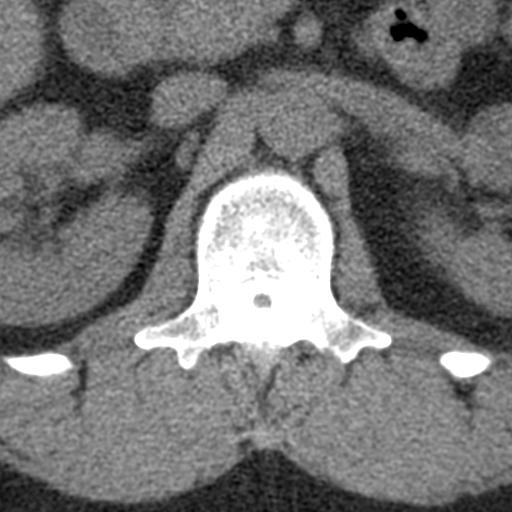
[im 61/73  bone]
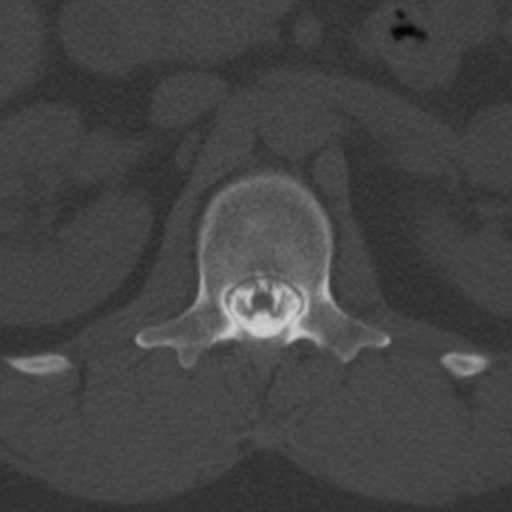

[5 of 14 positions shown; findings below may reference images not displayed]

EXAM:
LUMBAR MYELOGRAM

FLUOROSCOPY TIME:  47 seconds corresponding to a Dose Area Product
of 383.6 ?Gy*m2

PROCEDURE:
After thorough discussion of risks and benefits of the procedure
including bleeding, infection, injury to nerves, blood vessels,
adjacent structures as well as headache and CSF leak, written and
oral informed consent was obtained. Consent was obtained by Dr. Rabdau
Vilmar. Time out form was completed.

Patient was positioned prone on the fluoroscopy table. Local
anesthesia was provided with 1% lidocaine without epinephrine after
prepped and draped in the usual sterile fashion. Puncture was
performed at L4-5 using a 5 inch 22-gauge spinal needle via midline
approach. Using a single pass through the dura, the needle was
placed within the thecal sac, with return of clear CSF. 15 mL of
Isovue-M 200 was injected into the thecal sac, with normal
opacification of the nerve roots and cauda equina consistent with
free flow within the subarachnoid space.

I personally performed the lumbar puncture and administered the
intrathecal contrast. I also personally supervised acquisition of
the myelogram images.
FINDINGS: LUMBAR MYELOGRAM FINDINGS:

Good opacification lumbar subarachnoid space. No nerve root cut off
or spinal stenosis. Normal conus. Anatomic alignment.

With patient upright, anatomic alignment is maintained. There is no
dynamic instability. Incidental bladder stimulator wire through the
sacral foramina and RIGHT-sided battery pack.

CT LUMBAR MYELOGRAM FINDINGS:

Segmentation: Normal.

Alignment:  Normal.

Vertebrae: No worrisome osseous lesion.

Conus medullaris: Normal in size and echo

Paraspinal tissues: No evidence for hydronephrosis or paravertebral
mass. Early changes of aortic atherosclerosis with calcification.

Disc levels:

No disc protrusion or spinal stenosis.

Extraforaminal soft tissues were carefully examined, and there is no
evidence for RIGHT-side far-lateral protrusion or RIGHT-sided psoas
muscle lesion.
IMPRESSION: LUMBAR MYELOGRAM IMPRESSION:

Normal lumbar myelogram. Anatomic alignment without nerve root cut
off or spinal stenosis. No dynamic instability.

CT LUMBAR MYELOGRAM IMPRESSION:

Unremarkable post myelogram CT. No RIGHT-sided disc protrusion is
evident, in the subarticular zone, foraminal zone, or extraforaminal
compartment.

Aortic atherosclerosis.

## 2020-06-30 ENCOUNTER — Ambulatory Visit: Payer: BLUE CROSS/BLUE SHIELD | Admitting: Skilled Nursing Facility1

## 2020-08-11 ENCOUNTER — Encounter: Payer: BC Managed Care – PPO | Attending: Family Medicine | Admitting: Skilled Nursing Facility1

## 2020-08-11 ENCOUNTER — Encounter: Payer: Self-pay | Admitting: Skilled Nursing Facility1

## 2020-08-11 ENCOUNTER — Other Ambulatory Visit: Payer: Self-pay

## 2020-08-11 DIAGNOSIS — E669 Obesity, unspecified: Secondary | ICD-10-CM | POA: Insufficient documentation

## 2020-08-11 NOTE — Progress Notes (Signed)
Medical Nutrition Therapy  Appointment Start time:  2:05  Appointment End time:  3:01  Primary concerns today:  Referral diagnosis: Obesity Preferred learning style:  visual, hands on Learning readiness: not ready   NUTRITION ASSESSMENT   Anthropometrics    Clinical Medical Hx: GERD, interstitial cystitis, chronic pain, migraines  (anxiety/depression marked in [facility] but pt denies these) Medications: see list Labs:  Notable Signs/Symptoms: none stated  Lifestyle & Dietary Hx  Pt states she eats avocado and banana stating she is allergic to them and will just use a fork or knife to scratch.  Pt states he does not take her topamax.  Pt states she has GERD bad stating it wakes her in the night throwing up.  Pt states she stopped eating surgar as of January. Pt states when she is not at work she lays in bed all day and eats at night so has been eating peanuts stating the only sugar she etas is in her cramer stating she has reduced her amount for creamer. Pt states she already exercise at work so when she comes she does not ant to be active. Pt states she has lost down from over 300 pounds.  Pt states he quit smoking about 2 months ago. Pt states she does bordum eat.  Pt states she had a battery put in for her cystitis which she states works well for her. 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  Pt states she does not like to cook.   Estimated daily fluid intake: unknown oz Supplements: none stated Sleep: states she sleeps well Stress / self-care: pt states she handles stress well Current average weekly physical activity: walking 12-13000 stesp and uses the stairs  24-Hr Dietary Recall First Meal: 2 boiled eggs Snack: fruit with jello Second Meal: 1/4 sub + chips or peanuts or chips or yogurt Third Meal: chips or peanuts Snack: tacos + tortilla+ meat + cheese + sour cream + avocado + beans Beverages: vitamin water, gatorade zero, water  Estimated  Energy Needs Calories: 1500   NUTRITION INTERVENTION  Nutrition education (E-1) on the following topics:  . Inclusion of non starchy vegetables  Handouts Provided Include   Detailed MyPlate  Learning Style & Readiness for Change Teaching method utilized: Visual & Auditory  Demonstrated degree of understanding via: Teach Back  Barriers to learning/adherence to lifestyle change:   Goals Established by Pt . Do not eat any food in your bedroom . Find a hobby you enjoy . Be sure to have non starchy vegetables 2 times a day 7 days a week . Limit nuts and seeds to 1/4 cup per day   MONITORING & EVALUATION Dietary intake, weekly physical activity

## 2020-11-22 ENCOUNTER — Ambulatory Visit: Payer: BC Managed Care – PPO | Admitting: Skilled Nursing Facility1

## 2020-11-25 ENCOUNTER — Ambulatory Visit: Payer: BC Managed Care – PPO | Admitting: Skilled Nursing Facility1

## 2020-12-01 ENCOUNTER — Ambulatory Visit: Payer: BC Managed Care – PPO | Admitting: Skilled Nursing Facility1

## 2020-12-30 ENCOUNTER — Ambulatory Visit: Payer: BC Managed Care – PPO | Admitting: Skilled Nursing Facility1

## 2021-09-29 ENCOUNTER — Encounter: Payer: Self-pay | Admitting: *Deleted

## 2021-10-24 ENCOUNTER — Telehealth: Payer: Self-pay | Admitting: Internal Medicine

## 2021-10-24 NOTE — Telephone Encounter (Signed)
Can patient mother be called to schedule.   She works at [employer] and can not take calls [phone] ?

## 2021-10-24 NOTE — Telephone Encounter (Signed)
Called mother back and informed her that we would need to do a telephone visit with the patient.  She can be with her as well but patient needs to be present.  She voiced understanding. ?

## 2021-11-23 ENCOUNTER — Encounter: Payer: Self-pay | Admitting: *Deleted

## 2021-12-05 ENCOUNTER — Ambulatory Visit: Payer: BC Managed Care – PPO

## 2021-12-15 ENCOUNTER — Encounter: Payer: Self-pay | Admitting: *Deleted

## 2021-12-27 ENCOUNTER — Encounter: Payer: Self-pay | Admitting: *Deleted

## 2021-12-27 NOTE — Patient Instructions (Addendum)
Referring MD/PCP: Dr. Morene Rankins  Reason/Indication:  Colonoscopy  Has patient had this procedure before?  no  If so, when, by whom and where?    Is there a family history of colon cancer?  Yes grandparents  Who?  What age when diagnosed?    Is patient diabetic? If yes, Type 1 or Type 2   no      Does patient have prosthetic heart valve or mechanical valve?  no  Do you have a pacemaker/defibrillator?  no  Has patient ever had endocarditis/atrial fibrillation? no  Does patient use oxygen? no  Has patient had joint replacement within last 12 months?  no  Is patient constipated or do they take laxatives? no  Does patient have a history of alcohol/drug use?  no  Have you had a stroke/heart attack last 6 mths? no  Do you take medicine for weight loss?  no  For female patients,: have you had a hysterectomy yes                      are you post menopausal yes                      do you still have your menstrual cycle no  Is patient on blood thinner such as Coumadin, Plavix and/or Aspirin? no  Medications:  Current Outpatient Medications on File Prior to Visit  Medication Sig Dispense Refill   esomeprazole (NEXIUM) 40 MG capsule Take 40 mg by mouth daily at 12 noon.     HYDROcodone-acetaminophen (NORCO) 7.5-325 MG tablet Take 1 tablet by mouth in the morning, at noon, in the evening, and at bedtime.     No current facility-administered medications on file prior to visit.     Allergies:  Allergies  Allergen Reactions   Keflex [Cephalexin] Other (See Comments)    HALLUCINATIONS   Other     BELL PEPPER--  SOB/ THROAT SWELLS BANANA/ AVOCADO-- MOUTH  ITCHES   Prozac [Fluoxetine Hcl] Other (See Comments)    "flipped out - makes her angry"     Per referral BMI 48.6 as of 09/13/21. Weight 257lbs, height 5'1

## 2022-01-05 NOTE — Progress Notes (Signed)
Called spoke with pt/mom. Wants september for procedure. Will call once we receive that schedule

## 2022-01-24 NOTE — Progress Notes (Signed)
LMOVM to call back 

## 2022-03-22 NOTE — Progress Notes (Signed)
LMOVM to call back 

## 2022-04-04 ENCOUNTER — Encounter: Payer: Self-pay | Admitting: *Deleted

## 2022-04-04 NOTE — Progress Notes (Signed)
LMOVM. Letter mailed

## 2022-04-27 NOTE — Progress Notes (Signed)
Pt is also having reflux issues and wants to be seen in the office by a provider. Please schedule thanks

## 2022-05-02 ENCOUNTER — Encounter: Payer: Self-pay | Admitting: Internal Medicine

## 2022-06-16 ENCOUNTER — Encounter: Payer: Self-pay | Admitting: *Deleted

## 2022-06-16 ENCOUNTER — Ambulatory Visit (INDEPENDENT_AMBULATORY_CARE_PROVIDER_SITE_OTHER): Payer: BC Managed Care – PPO | Admitting: Gastroenterology

## 2022-06-16 ENCOUNTER — Encounter: Payer: Self-pay | Admitting: Gastroenterology

## 2022-06-16 VITALS — BP 133/88 | HR 51 | Temp 98.0°F | Ht 61.0 in | Wt 260.0 lb

## 2022-06-16 DIAGNOSIS — R1310 Dysphagia, unspecified: Secondary | ICD-10-CM | POA: Diagnosis not present

## 2022-06-16 DIAGNOSIS — Z1211 Encounter for screening for malignant neoplasm of colon: Secondary | ICD-10-CM | POA: Diagnosis not present

## 2022-06-16 DIAGNOSIS — K219 Gastro-esophageal reflux disease without esophagitis: Secondary | ICD-10-CM

## 2022-06-16 MED ORDER — PANTOPRAZOLE SODIUM 40 MG PO TBEC: 40.0000 mg | DELAYED_RELEASE_TABLET | Freq: Two times a day (BID) | ORAL | 3 refills | Status: AC

## 2022-06-16 MED ORDER — NA SULFATE-K SULFATE-MG SULF 17.5-3.13-1.6 GM/177ML PO SOLN: ORAL | 0 refills | Status: AC

## 2022-06-16 NOTE — Patient Instructions (Addendum)
Colonoscopy and upper endoscopy in near future.  See separate instructions. Stop nexium. Start pantoprazole '40mg'$  twice daily before a meal.
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
## 2022-07-07 NOTE — Patient Instructions (Signed)
Pamela Leonard  07/07/2022     '@PREFPERIOPPHARMACY'$ @   Your procedure is scheduled on  07/14/2022.   Report to [facility] at  0600  A.M.   Call this number if you have problems the morning of surgery:  [phone]  If you experience any cold or flu symptoms such as cough, fever, chills, shortness of breath, etc. between now and your scheduled surgery, please notify us at the above number.   Remember:  Follow the diet and prep instructions given to you by the office.     Take these medicines the morning of surgery with A SIP OF WATER                  hydrocodone(if needed), pantoprazole.     Do not wear jewelry, make-up or nail polish.  Do not wear lotions, powders, or perfumes, or deodorant.  Do not shave 48 hours prior to surgery.  Men may shave face and neck.  Do not bring valuables to the hospital.  [facility] is not responsible for any belongings or valuables.  Contacts, dentures or bridgework may not be worn into surgery.  Leave your suitcase in the car.  After surgery it may be brought to your room.  For patients admitted to the hospital, discharge time will be determined by your treatment team.  Patients discharged the day of surgery will not be allowed to drive home and must have someone with them for 24 hours.    Special instructions:   DO NOT smoke tobacco or vape for 24 hours before your procedure.  Please read over the following fact sheets that you were given. Anesthesia Post-op Instructions and Care and Recovery After Surgery      Upper Endoscopy, Adult, Care After After the procedure, it is common to have a sore throat. It is also common to have: Mild stomach pain or discomfort. Bloating. Nausea. Follow these instructions at home: The instructions below may help you care for yourself at home. Your health care provider may give you more instructions. If you have questions, ask your health care provider. If you were given a sedative  during the procedure, it can affect you for several hours. Do not drive or operate machinery until your health care provider says that it is safe. If you will be going home right after the procedure, plan to have a responsible adult: Take you home from the hospital or clinic. You will not be allowed to drive. Care for you for the time you are told. Follow instructions from your health care provider about what you may eat and drink. Return to your normal activities as told by your health care provider. Ask your health care provider what activities are safe for you. Take over-the-counter and prescription medicines only as told by your health care provider. Contact a health care provider if you: Have a sore throat that lasts longer than one day. Have trouble swallowing. Have a fever. Get help right away if you: Vomit blood or your vomit looks like coffee grounds. Have bloody, black, or tarry stools. Have a very bad sore throat or you cannot swallow. Have difficulty breathing or very bad pain in your chest or abdomen. These symptoms may be an emergency. Get help right away. Call 911. Do not wait to see if the symptoms will go away. Do not drive yourself to the hospital. Summary After the procedure, it is common to have a sore throat,  mild stomach discomfort, bloating, and nausea. If you were given a sedative during the procedure, it can affect you for several hours. Do not drive until your health care provider says that it is safe. Follow instructions from your health care provider about what you may eat and drink. Return to your normal activities as told by your health care provider. This information is not intended to replace advice given to you by your health care provider. Make sure you discuss any questions you have with your health care provider. Document Revised: 09/14/2021 Document Reviewed: 09/14/2021 Elsevier Patient Education  [place]. Esophageal Dilatation Esophageal  dilatation, also called esophageal dilation, is a procedure to widen or open a blocked or narrowed part of the esophagus. The esophagus is the part of the body that moves food and liquid from the mouth to the stomach. You may need this procedure if: You have a buildup of scar tissue in your esophagus that makes it difficult, painful, or impossible to swallow. This can be caused by gastroesophageal reflux disease (GERD). You have cancer of the esophagus. There is a problem with how food moves through your esophagus. In some cases, you may need this procedure repeated at a later time to dilate the esophagus gradually. Tell a health care provider about: Any allergies you have. All medicines you are taking, including vitamins, herbs, eye drops, creams, and over-the-counter medicines. Any problems you or family members have had with anesthetic medicines. Any blood disorders you have. Any surgeries you have had. Any medical conditions you have. Any antibiotic medicines you are required to take before dental procedures. Whether you are pregnant or may be pregnant. What are the risks? Generally, this is a safe procedure. However, problems may occur, including: Bleeding due to a tear in the lining of the esophagus. A hole, or perforation, in the esophagus. What happens before the procedure? Ask your health care provider about: Changing or stopping your regular medicines. This is especially important if you are taking diabetes medicines or blood thinners. Taking medicines such as aspirin and ibuprofen. These medicines can thin your blood. Do not take these medicines unless your health care provider tells you to take them. Taking over-the-counter medicines, vitamins, herbs, and supplements. Follow instructions from your health care provider about eating or drinking restrictions. Plan to have a responsible adult take you home from the hospital or clinic. Plan to have a responsible adult care for you for  the time you are told after you leave the hospital or clinic. This is important. What happens during the procedure? You may be given a medicine to help you relax (sedative). A numbing medicine may be sprayed into the back of your throat, or you may gargle the medicine. Your health care provider may perform the dilatation using various surgical instruments, such as: Simple dilators. This instrument is carefully placed in the esophagus to stretch it. Guided wire bougies. This involves using an endoscope to insert a wire into the esophagus. A dilator is passed over this wire to enlarge the esophagus. Then the wire is removed. Balloon dilators. An endoscope with a small balloon is inserted into the esophagus. The balloon is inflated to stretch the esophagus and open it up. The procedure may vary among health care providers and hospitals. What can I expect after the procedure? Your blood pressure, heart rate, breathing rate, and blood oxygen level will be monitored until you leave the hospital or clinic. Your throat may feel slightly sore and numb. This will get  better over time. You will not be allowed to eat or drink until your throat is no longer numb. When you are able to drink, urinate, and sit on the edge of the bed without nausea or dizziness, you may be able to return home. Follow these instructions at home: Take over-the-counter and prescription medicines only as told by your health care provider. If you were given a sedative during the procedure, it can affect you for several hours. Do not drive or operate machinery until your health care provider says that it is safe. Plan to have a responsible adult care for you for the time you are told. This is important. Follow instructions from your health care provider about any eating or drinking restrictions. Do not use any products that contain nicotine or tobacco, such as cigarettes, e-cigarettes, and chewing tobacco. If you need help quitting, ask  your health care provider. Keep all follow-up visits. This is important. Contact a health care provider if: You have a fever. You have pain that is not relieved by medicine. Get help right away if: You have chest pain. You have trouble breathing. You have trouble swallowing. You vomit blood. You have black, tarry, or bloody stools. These symptoms may represent a serious problem that is an emergency. Do not wait to see if the symptoms will go away. Get medical help right away. Call your local emergency services (911 in the [place] Do not drive yourself to the hospital. Summary Esophageal dilatation, also called esophageal dilation, is a procedure to widen or open a blocked or narrowed part of the esophagus. Plan to have a responsible adult take you home from the hospital or clinic. For this procedure, a numbing medicine may be sprayed into the back of your throat, or you may gargle the medicine. Do not drive or operate machinery until your health care provider says that it is safe. This information is not intended to replace advice given to you by your health care provider. Make sure you discuss any questions you have with your health care provider. Document Revised: 10/22/2019 Document Reviewed: 10/22/2019 Elsevier Patient Education  [place]. Colonoscopy, Adult, Care After The following information offers guidance on how to care for yourself after your procedure. Your health care provider may also give you more specific instructions. If you have problems or questions, contact your health care provider. What can I expect after the procedure? After the procedure, it is common to have: A small amount of blood in your stool for 24 hours after the procedure. Some gas. Mild cramping or bloating of your abdomen. Follow these instructions at home: Eating and drinking  Drink enough fluid to keep your urine pale yellow. Follow instructions from your health care provider about eating or  drinking restrictions. Resume your normal diet as told by your health care provider. Avoid heavy or fried foods that are hard to digest. Activity Rest as told by your health care provider. Avoid sitting for a long time without moving. Get up to take short walks every 1-2 hours. This is important to improve blood flow and breathing. Ask for help if you feel weak or unsteady. Return to your normal activities as told by your health care provider. Ask your health care provider what activities are safe for you. Managing cramping and bloating  Try walking around when you have cramps or feel bloated. If directed, apply heat to your abdomen as told by your health care provider. Use the heat source that your health care provider  recommends, such as a moist heat pack or a heating pad. Place a towel between your skin and the heat source. Leave the heat on for 20-30 minutes. Remove the heat if your skin turns bright red. This is especially important if you are unable to feel pain, heat, or cold. You have a greater risk of getting burned. General instructions If you were given a sedative during the procedure, it can affect you for several hours. Do not drive or operate machinery until your health care provider says that it is safe. For the first 24 hours after the procedure: Do not sign important documents. Do not drink alcohol. Do your regular daily activities at a slower pace than normal. Eat soft foods that are easy to digest. Take over-the-counter and prescription medicines only as told by your health care provider. Keep all follow-up visits. This is important. Contact a health care provider if: You have blood in your stool 2-3 days after the procedure. Get help right away if: You have more than a small spotting of blood in your stool. You have large blood clots in your stool. You have swelling of your abdomen. You have nausea or vomiting. You have a fever. You have increasing pain in your  abdomen that is not relieved with medicine. These symptoms may be an emergency. Get help right away. Call 911. Do not wait to see if the symptoms will go away. Do not drive yourself to the hospital. Summary After the procedure, it is common to have a small amount of blood in your stool. You may also have mild cramping and bloating of your abdomen. If you were given a sedative during the procedure, it can affect you for several hours. Do not drive or operate machinery until your health care provider says that it is safe. Get help right away if you have a lot of blood in your stool, nausea or vomiting, a fever, or increased pain in your abdomen. This information is not intended to replace advice given to you by your health care provider. Make sure you discuss any questions you have with your health care provider. Document Revised: 01/26/2021 Document Reviewed: 01/26/2021 Elsevier Patient Education  [place] After The following information offers guidance on how to care for yourself after your procedure. Your health care provider may also give you more specific instructions. If you have problems or questions, contact your health care provider. What can I expect after the procedure? After the procedure, it is common to have: Tiredness. Little or no memory about what happened during or after the procedure. Impaired judgment when it comes to making decisions. Nausea or vomiting. Some trouble with balance. Follow these instructions at home: For the time period you were told by your health care provider:  Rest. Do not participate in activities where you could fall or become injured. Do not drive or use machinery. Do not drink alcohol. Do not take sleeping pills or medicines that cause drowsiness. Do not make important decisions or sign legal documents. Do not take care of children on your own. Medicines Take over-the-counter and prescription medicines  only as told by your health care provider. If you were prescribed antibiotics, take them as told by your health care provider. Do not stop using the antibiotic even if you start to feel better. Eating and drinking Follow instructions from your health care provider about what you may eat and drink. Drink enough fluid to keep your urine pale yellow. If you  vomit: Drink clear fluids slowly and in small amounts as you are able. Clear fluids include water, ice chips, low-calorie sports drinks, and fruit juice that has water added to it (diluted fruit juice). Eat light and bland foods in small amounts as you are able. These foods include bananas, applesauce, rice, lean meats, toast, and crackers. General instructions  Have a responsible adult stay with you for the time you are told. It is important to have someone help care for you until you are awake and alert. If you have sleep apnea, surgery and some medicines can increase your risk for breathing problems. Follow instructions from your health care provider about wearing your sleep device: When you are sleeping. This includes during daytime naps. While taking prescription pain medicines, sleeping medicines, or medicines that make you drowsy. Do not use any products that contain nicotine or tobacco. These products include cigarettes, chewing tobacco, and vaping devices, such as e-cigarettes. If you need help quitting, ask your health care provider. Contact a health care provider if: You feel nauseous or vomit every time you eat or drink. You feel light-headed. You are still sleepy or having trouble with balance after 24 hours. You get a rash. You have a fever. You have redness or swelling around the IV site. Get help right away if: You have trouble breathing. You have new confusion after you get home. These symptoms may be an emergency. Get help right away. Call 911. Do not wait to see if the symptoms will go away. Do not drive yourself to the  hospital. This information is not intended to replace advice given to you by your health care provider. Make sure you discuss any questions you have with your health care provider. Document Revised: 10/31/2021 Document Reviewed: 10/31/2021 Elsevier Patient Education  [place].

## 2022-07-11 ENCOUNTER — Encounter (HOSPITAL_COMMUNITY)
Admission: RE | Admit: 2022-07-11 | Discharge: 2022-07-11 | Disposition: A | Discharge status: Home / Self Care | Payer: BC Managed Care – PPO | Source: Ambulatory Visit | Attending: Internal Medicine | Admitting: Internal Medicine

## 2022-07-11 ENCOUNTER — Encounter (HOSPITAL_COMMUNITY): Payer: Self-pay

## 2022-07-11 VITALS — BP 121/81 | HR 51 | Temp 97.7°F | Resp 18 | Ht 61.0 in | Wt 259.9 lb

## 2022-07-11 DIAGNOSIS — I493 Ventricular premature depolarization: Secondary | ICD-10-CM | POA: Insufficient documentation

## 2022-07-11 DIAGNOSIS — K219 Gastro-esophageal reflux disease without esophagitis: Secondary | ICD-10-CM | POA: Diagnosis not present

## 2022-07-11 DIAGNOSIS — F17210 Nicotine dependence, cigarettes, uncomplicated: Secondary | ICD-10-CM | POA: Diagnosis not present

## 2022-07-11 DIAGNOSIS — Z6841 Body Mass Index (BMI) 40.0 and over, adult: Secondary | ICD-10-CM | POA: Diagnosis not present

## 2022-07-11 DIAGNOSIS — R638 Other symptoms and signs concerning food and fluid intake: Secondary | ICD-10-CM | POA: Insufficient documentation

## 2022-07-11 DIAGNOSIS — Z9049 Acquired absence of other specified parts of digestive tract: Secondary | ICD-10-CM | POA: Diagnosis not present

## 2022-07-11 DIAGNOSIS — R1310 Dysphagia, unspecified: Secondary | ICD-10-CM | POA: Diagnosis not present

## 2022-07-11 DIAGNOSIS — Z1211 Encounter for screening for malignant neoplasm of colon: Secondary | ICD-10-CM | POA: Diagnosis not present

## 2022-07-11 DIAGNOSIS — Z79899 Other long term (current) drug therapy: Secondary | ICD-10-CM | POA: Diagnosis not present

## 2022-07-11 DIAGNOSIS — Z01810 Encounter for preprocedural cardiovascular examination: Secondary | ICD-10-CM | POA: Insufficient documentation

## 2022-07-11 DIAGNOSIS — K635 Polyp of colon: Secondary | ICD-10-CM | POA: Diagnosis not present

## 2022-07-11 DIAGNOSIS — N3010 Interstitial cystitis (chronic) without hematuria: Secondary | ICD-10-CM | POA: Diagnosis not present

## 2022-07-11 DIAGNOSIS — Z79891 Long term (current) use of opiate analgesic: Secondary | ICD-10-CM | POA: Diagnosis not present

## 2022-07-11 DIAGNOSIS — Z800 Family history of malignant neoplasm of digestive organs: Secondary | ICD-10-CM | POA: Diagnosis not present

## 2022-07-14 ENCOUNTER — Ambulatory Visit (HOSPITAL_COMMUNITY): Payer: BC Managed Care – PPO | Admitting: Anesthesiology

## 2022-07-14 ENCOUNTER — Encounter (HOSPITAL_COMMUNITY)
Admission: RE | Disposition: A | Discharge status: Home / Self Care | Payer: Self-pay | Source: Home / Self Care | Attending: Internal Medicine

## 2022-07-14 ENCOUNTER — Ambulatory Visit (HOSPITAL_COMMUNITY)
Admission: RE | Admit: 2022-07-14 | Discharge: 2022-07-14 | Disposition: A | Discharge status: Home / Self Care | Payer: BC Managed Care – PPO | Attending: Internal Medicine | Admitting: Internal Medicine

## 2022-07-14 DIAGNOSIS — R1310 Dysphagia, unspecified: Secondary | ICD-10-CM

## 2022-07-14 DIAGNOSIS — Z800 Family history of malignant neoplasm of digestive organs: Secondary | ICD-10-CM | POA: Insufficient documentation

## 2022-07-14 DIAGNOSIS — Z83719 Family history of colon polyps, unspecified: Secondary | ICD-10-CM

## 2022-07-14 DIAGNOSIS — K635 Polyp of colon: Secondary | ICD-10-CM | POA: Insufficient documentation

## 2022-07-14 DIAGNOSIS — D128 Benign neoplasm of rectum: Secondary | ICD-10-CM | POA: Diagnosis not present

## 2022-07-14 DIAGNOSIS — Z79899 Other long term (current) drug therapy: Secondary | ICD-10-CM | POA: Insufficient documentation

## 2022-07-14 DIAGNOSIS — Z1211 Encounter for screening for malignant neoplasm of colon: Secondary | ICD-10-CM | POA: Diagnosis not present

## 2022-07-14 DIAGNOSIS — F17210 Nicotine dependence, cigarettes, uncomplicated: Secondary | ICD-10-CM | POA: Insufficient documentation

## 2022-07-14 DIAGNOSIS — Z79891 Long term (current) use of opiate analgesic: Secondary | ICD-10-CM | POA: Insufficient documentation

## 2022-07-14 DIAGNOSIS — K219 Gastro-esophageal reflux disease without esophagitis: Secondary | ICD-10-CM

## 2022-07-14 DIAGNOSIS — N3010 Interstitial cystitis (chronic) without hematuria: Secondary | ICD-10-CM | POA: Insufficient documentation

## 2022-07-14 DIAGNOSIS — Z6841 Body Mass Index (BMI) 40.0 and over, adult: Secondary | ICD-10-CM | POA: Insufficient documentation

## 2022-07-14 DIAGNOSIS — Z9049 Acquired absence of other specified parts of digestive tract: Secondary | ICD-10-CM | POA: Insufficient documentation

## 2022-07-14 HISTORY — PX: ESOPHAGOGASTRODUODENOSCOPY (EGD) WITH PROPOFOL: SHX5813

## 2022-07-14 HISTORY — PX: COLONOSCOPY WITH PROPOFOL: SHX5780

## 2022-07-14 HISTORY — PX: POLYPECTOMY: SHX149

## 2022-07-14 HISTORY — PX: MALONEY DILATION: SHX5535

## 2022-07-14 SURGERY — COLONOSCOPY WITH PROPOFOL
Anesthesia: General

## 2022-07-14 MED ORDER — LACTATED RINGERS IV SOLN: INTRAVENOUS | Status: DC | PRN

## 2022-07-14 MED ORDER — PROPOFOL 10 MG/ML IV BOLUS
INTRAVENOUS | Status: DC | PRN
  Administered 2022-07-14: 100 mg via INTRAVENOUS

## 2022-07-14 MED ORDER — PROPOFOL 500 MG/50ML IV EMUL
INTRAVENOUS | Status: DC | PRN
  Administered 2022-07-14: 150 ug/kg/min via INTRAVENOUS

## 2022-07-14 MED ORDER — LIDOCAINE HCL (CARDIAC) PF 100 MG/5ML IV SOSY
PREFILLED_SYRINGE | INTRAVENOUS | Status: DC | PRN
  Administered 2022-07-14: 100 mg via INTRAVENOUS

## 2022-07-14 NOTE — Anesthesia Preprocedure Evaluation (Addendum)
Anesthesia Evaluation  Patient identified by MRN, date of birth, ID band Patient awake    Reviewed: Allergy & Precautions, H&P , NPO status , Patient's Chart, lab work & pertinent test results  Airway Mallampati: III  TM Distance: >3 FB Neck ROM: Full    Dental no notable dental hx. (+) Dental Advisory Given, Caps Crowns:   Pulmonary asthma , Current Smoker and Patient abstained from smoking.   Pulmonary exam normal breath sounds clear to auscultation       Cardiovascular negative cardio ROS Normal cardiovascular exam Rhythm:Regular Rate:Normal     Neuro/Psych  Headaches  Neuromuscular disease  negative psych ROS   GI/Hepatic Neg liver ROS,GERD  Medicated and Poorly Controlled,,  Endo/Other    Morbid obesity  Renal/GU negative Renal ROS Bladder dysfunction (interstetial cystitis)      Musculoskeletal  (+)  narcotic dependent  Abdominal   Peds negative pediatric ROS (+)  Hematology negative hematology ROS (+)   Anesthesia Other Findings Chronic pain syndromeChronic right-sided low back pain with right-sided sciaticaDysphagiaEncounter for screening colonoscopyGERD (gastroesophageal reflux disease)Meralgia paresthetica of right sideNarcotic dependence (HCC)Sprain of medial collateral ligament of left knee   Reproductive/Obstetrics negative OB ROS                             Anesthesia Physical Anesthesia Plan  ASA: 3  Anesthesia Plan: General   Post-op Pain Management: Minimal or no pain anticipated   Induction: Intravenous  PONV Risk Score and Plan: 1 and Propofol infusion  Airway Management Planned: Nasal Cannula, Natural Airway and Simple Face Mask  Additional Equipment:   Intra-op Plan:   Post-operative Plan:   Informed Consent: I have reviewed the patients History and Physical, chart, labs and discussed the procedure including the risks, benefits and alternatives for the  proposed anesthesia with the patient or authorized representative who has indicated his/her understanding and acceptance.     Dental advisory given  Plan Discussed with: CRNA and Surgeon  Anesthesia Plan Comments:        Anesthesia Quick Evaluation

## 2022-07-14 NOTE — Op Note (Signed)
[facility] Patient Name: Pamela Leonard Procedure Date: 07/14/2022 7:06 AM MRN: [id number] Date of Birth: May 17, 1976 Attending MD: Norvel Richards , MD, [id number] CSN: [id number] Age: 47 Admit Type: Outpatient Procedure:                Upper GI endoscopy Indications:              Dysphagia Providers:                Norvel Richards, MD, Crystal Page, Raphael Gibney, Technician Referring MD:              Medicines:                Propofol per Anesthesia Complications:            No immediate complications. Estimated Blood Loss:     Estimated blood loss: none. Procedure:                Pre-Anesthesia Assessment:                           - Prior to the procedure, a History and Physical                            was performed, and patient medications and                            allergies were reviewed. The patient's tolerance of                            previous anesthesia was also reviewed. The risks                            and benefits of the procedure and the sedation                            options and risks were discussed with the patient.                            All questions were answered, and informed consent                            was obtained. Prior Anticoagulants: The patient has                            taken no anticoagulant or antiplatelet agents. ASA                            Grade Assessment: II - A patient with mild systemic                            disease. After reviewing the risks and benefits,  the patient was deemed in satisfactory condition to                            undergo the procedure.                           After obtaining informed consent, the endoscope was                            passed under direct vision. Throughout the                            procedure, the patient's blood pressure, pulse, and                            oxygen saturations were monitored  continuously. The                            [id number]-H190 ([id number]) scope was introduced through the                            mouth, and advanced to the second part of duodenum.                            The upper GI endoscopy was accomplished without                            difficulty. The patient tolerated the procedure                            well. Scope In: 7:46:26 AM Scope Out: 7:52:22 AM Total Procedure Duration: 0 hours 5 minutes 56 seconds  Findings:      The examined esophagus was normal.      The entire examined stomach was normal.      The duodenal bulb and second portion of the duodenum were normal. The       scope was withdrawn. Dilation was performed with a Maloney dilator with       no resistance at 41 Fr. The scope was withdrawn. Dilation was performed       with a Maloney dilator with mild resistance at 56 Fr. The dilation site       was examined following endoscope reinsertion and showed no change.       Estimated blood loss: none. Impression:               - Normal esophagus. Dilated.                           - Normal stomach.                           - Normal duodenal bulb and second portion of the                            duodenum.                           -  No specimens collected. Moderate Sedation:      Moderate (conscious) sedation was personally administered by an       anesthesia professional. The following parameters were monitored: oxygen       saturation, heart rate, blood pressure, respiratory rate, EKG, adequacy       of pulmonary ventilation, and response to care. Recommendation:           - Patient has a contact number available for                            emergencies. The signs and symptoms of potential                            delayed complications were discussed with the                            patient. Return to normal activities tomorrow.                            Written discharge instructions were provided to the                             patient.                           - Advance diet as tolerated.                           - Continue present medications. Continue                            pantoprazole 40 mg twice daily. Office visit 6                            weeks. Procedure Code(s):        --- Professional ---                           [phone], Esophagogastroduodenoscopy, flexible,                            transoral; diagnostic, including collection of                            specimen(s) by brushing or washing, when performed                            (separate procedure)                           43450, Dilation of esophagus, by unguided sound or                            bougie, single or multiple passes Diagnosis Code(s):        --- Professional ---  R13.10, Dysphagia, unspecified CPT copyright 2022 American Medical Association. All rights reserved. The codes documented in this report are preliminary and upon coder review may  be revised to meet current compliance requirements. Cristopher Estimable. Leea Rambeau, MD Norvel Richards, MD 07/14/2022 8:24:04 AM This report has been signed electronically. Number of Addenda: 0

## 2022-07-14 NOTE — Anesthesia Postprocedure Evaluation (Signed)
Anesthesia Post Note  Patient: Research scientist (life sciences)  Procedure(s) Performed: COLONOSCOPY WITH PROPOFOL ESOPHAGOGASTRODUODENOSCOPY (EGD) WITH PROPOFOL [place]  Patient location during evaluation: Phase II Anesthesia Type: General Level of consciousness: awake and alert and oriented Pain management: pain level controlled Vital Signs Assessment: post-procedure vital signs reviewed and stable Respiratory status: spontaneous breathing, nonlabored ventilation and respiratory function stable Cardiovascular status: blood pressure returned to baseline and stable Postop Assessment: no apparent nausea or vomiting Anesthetic complications: no  No notable events documented.   Last Vitals:  Vitals:   07/14/22 0821 07/14/22 0822  BP:  99/69  Pulse: 74   Resp: 16   Temp:  36.4 C  SpO2: 93%     Last Pain:  Vitals:   07/14/22 0822  TempSrc: Oral  PainSc: 0-No pain                 Wilbert Schouten C Taira Knabe

## 2022-07-14 NOTE — Discharge Instructions (Addendum)
Colonoscopy Discharge Instructions  Read the instructions outlined below and refer to this sheet in the next few weeks. These discharge instructions provide you with general information on caring for yourself after you leave the hospital. Your doctor may also give you specific instructions. While your treatment has been planned according to the most current medical practices available, unavoidable complications occasionally occur. If you have any problems or questions after discharge, call Dr. Gala Romney at [phone]. ACTIVITY You may resume your regular activity, but move at a slower pace for the next 24 hours.  Take frequent rest periods for the next 24 hours.  Walking will help get rid of the air and reduce the bloated feeling in your belly (abdomen).  No driving for 24 hours (because of the medicine (anesthesia) used during the test).   Do not sign any important legal documents or operate any machinery for 24 hours (because of the anesthesia used during the test).  NUTRITION Drink plenty of fluids.  You may resume your normal diet as instructed by your doctor.  Begin with a light meal and progress to your normal diet. Heavy or fried foods are harder to digest and may make you feel sick to your stomach (nauseated).  Avoid alcoholic beverages for 24 hours or as instructed.  MEDICATIONS You may resume your normal medications unless your doctor tells you otherwise.  WHAT YOU CAN EXPECT TODAY Some feelings of bloating in the abdomen.  Passage of more gas than usual.  Spotting of blood in your stool or on the toilet paper.  IF YOU HAD POLYPS REMOVED DURING THE COLONOSCOPY: No aspirin products for 7 days or as instructed.  No alcohol for 7 days or as instructed.  Eat a soft diet for the next 24 hours.  FINDING OUT THE RESULTS OF YOUR TEST Not all test results are available during your visit. If your test results are not back during the visit, make an appointment with your caregiver to find out the  results. Do not assume everything is normal if you have not heard from your caregiver or the medical facility. It is important for you to follow up on all of your test results.  SEEK IMMEDIATE MEDICAL ATTENTION IF: You have more than a spotting of blood in your stool.  Your belly is swollen (abdominal distention).  You are nauseated or vomiting.  You have a temperature over 101.  You have abdominal pain or discomfort that is severe or gets worse throughout the day.   EGD Discharge instructions Please read the instructions outlined below and refer to this sheet in the next few weeks. These discharge instructions provide you with general information on caring for yourself after you leave the hospital. Your doctor may also give you specific instructions. While your treatment has been planned according to the most current medical practices available, unavoidable complications occasionally occur. If you have any problems or questions after discharge, please call your doctor. ACTIVITY You may resume your regular activity but move at a slower pace for the next 24 hours.  Take frequent rest periods for the next 24 hours.  Walking will help expel (get rid of) the air and reduce the bloated feeling in your abdomen.  No driving for 24 hours (because of the anesthesia (medicine) used during the test).  You may shower.  Do not sign any important legal documents or operate any machinery for 24 hours (because of the anesthesia used during the test).  NUTRITION Drink plenty of fluids.  You may  resume your normal diet.  Begin with a light meal and progress to your normal diet.  Avoid alcoholic beverages for 24 hours or as instructed by your caregiver.  MEDICATIONS You may resume your normal medications unless your caregiver tells you otherwise.  WHAT YOU CAN EXPECT TODAY You may experience abdominal discomfort such as a feeling of fullness or "gas" pains.  FOLLOW-UP Your doctor will discuss the results of  your test with you.  SEEK IMMEDIATE MEDICAL ATTENTION IF ANY OF THE FOLLOWING OCCUR: Excessive nausea (feeling sick to your stomach) and/or vomiting.  Severe abdominal pain and distention (swelling).  Trouble swallowing.  Temperature over 101 F (37.8 C).  Rectal bleeding or vomiting of blood.    Your upper endoscopy appeared normal today.  Your esophagus was dilated  Continue Protonix 40 mg twice daily before breakfast and supper  1 small polyp in your colon removed  Office visit with Neil Crouch in 6 weeks  Further recommendations to follow pending review of pathology report    At patient request, I called Carloyn Jaeger at [phone] -reviewed findings and recommendations

## 2022-07-14 NOTE — Transfer of Care (Signed)
Immediate Anesthesia Transfer of Care Note  Patient: Pamela Leonard  Procedure(s) Performed: COLONOSCOPY WITH PROPOFOL ESOPHAGOGASTRODUODENOSCOPY (EGD) WITH PROPOFOL MALONEY DILATION POLYPECTOMY INTESTINAL  Patient Location: Short Stay  Anesthesia Type:General  Level of Consciousness: awake, alert , oriented, and patient cooperative  Airway & Oxygen Therapy: Patient Spontanous Breathing and Patient connected to nasal cannula oxygen  Post-op Assessment: Report given to RN, Post -op Vital signs reviewed and stable, and Patient moving all extremities X 4  Post vital signs: Reviewed and stable  Last Vitals:  Vitals Value Taken Time  BP    Temp    Pulse 74 07/14/22 0821  Resp 16 07/14/22 0821  SpO2 93 % 07/14/22 0821    Last Pain:  Vitals:   07/14/22 0821  TempSrc: Oral  PainSc:       Patients Stated Pain Goal: 7 (04/59/97 7414)  Complications: No notable events documented.

## 2022-07-14 NOTE — Op Note (Signed)
[facility] Patient Name: Pamela Leonard Procedure Date: 07/14/2022 7:05 AM MRN: [id number] Date of Birth: [birth year] Attending MD: Norvel Richards , MD, [id number] CSN: [id number] Age: 47 Admit Type: Outpatient Procedure:                Colonoscopy Indications:              Colon cancer screening in patient at increased                            risk: Family history of 1st-degree relative with                            colon polyps Providers:                Norvel Richards, MD, Crystal Page, Raphael Gibney, Technician Referring MD:              Medicines:                Propofol per Anesthesia Complications:            No immediate complications. Estimated Blood Loss:     Estimated blood loss was minimal. Procedure:                Pre-Anesthesia Assessment:                           - Prior to the procedure, a History and Physical                            was performed, and patient medications and                            allergies were reviewed. The patient's tolerance of                            previous anesthesia was also reviewed. The risks                            and benefits of the procedure and the sedation                            options and risks were discussed with the patient.                            All questions were answered, and informed consent                            was obtained. Prior Anticoagulants: The patient has                            taken no anticoagulant or antiplatelet agents. ASA  Grade Assessment: II - A patient with mild systemic                            disease. After reviewing the risks and benefits,                            the patient was deemed in satisfactory condition to                            undergo the procedure.                           After obtaining informed consent, the colonoscope                            was passed under direct vision.  Throughout the                            procedure, the patient's blood pressure, pulse, and                            oxygen saturations were monitored continuously. The                            [id number]) scope was introduced through the                            anus and advanced to the the cecum, identified by                            appendiceal orifice and ileocecal valve. The                            colonoscopy was performed without difficulty. The                            patient tolerated the procedure well. The quality                            of the bowel preparation was adequate. The                            ileocecal valve, appendiceal orifice, and rectum                            were photographed. Scope In: 7:57:40 AM Scope Out: 8:15:31 AM Scope Withdrawal Time: 0 hours 10 minutes 57 seconds  Total Procedure Duration: 0 hours 17 minutes 51 seconds  Findings:      The perianal and digital rectal examinations were normal.      A 4 mm polyp was found in the distal rectum. The polyp was sessile. The       polyp was removed with a cold snare. Resection and retrieval were       complete. Estimated blood loss was minimal.  The exam was otherwise without abnormality on direct and retroflexion       views. Impression:               - One 4 mm polyp in the distal rectum, removed with                            a cold snare. Resected and retrieved.                           - The examination was otherwise normal on direct                            and retroflexion views. Moderate Sedation:      Moderate (conscious) sedation was personally administered by an       anesthesia professional. The following parameters were monitored: oxygen       saturation, heart rate, blood pressure, respiratory rate, EKG, adequacy       of pulmonary ventilation, and response to care. Recommendation:           - Patient has a contact number available for                             emergencies. The signs and symptoms of potential                            delayed complications were discussed with the                            patient. Return to normal activities tomorrow.                            Written discharge instructions were provided to the                            patient.                           - Resume previous diet.                           - Continue present medications.                           - Repeat colonoscopy date to be determined after                            pending pathology results are reviewed for                            surveillance based on pathology results.                           - Return to GI office in 6 weeks. See EGD report. Procedure Code(s):        --- Professional ---  45385, Colonoscopy, flexible; with removal of                            tumor(s), polyp(s), or other lesion(s) by snare                            technique Diagnosis Code(s):        --- Professional ---                           Z83.71, Family history of colonic polyps                           D12.8, Benign neoplasm of rectum CPT copyright 2022 American Medical Association. All rights reserved. The codes documented in this report are preliminary and upon coder review may  be revised to meet current compliance requirements. Cristopher Estimable. Sahith Nurse, MD Norvel Richards, MD 07/14/2022 8:32:22 AM This report has been signed electronically. Number of Addenda: 0

## 2022-07-14 NOTE — Interval H&P Note (Signed)
History and Physical Interval Note:  07/14/2022 7:19 AM  Pamela Leonard  has presented today for surgery, with the diagnosis of GERD,DYSPHAGIA,SCREENING COLONOSCOPY.  The various methods of treatment have been discussed with the patient and family. After consideration of risks, benefits and other options for treatment, the patient has consented to  Procedure(s) with comments: COLONOSCOPY WITH PROPOFOL (N/A) - 7:30 AM ESOPHAGOGASTRODUODENOSCOPY (EGD) WITH PROPOFOL (N/A) MALONEY DILATION (N/A) as a surgical intervention.  The patient's history has been reviewed, patient examined, no change in status, stable for surgery.  I have reviewed the patient's chart and labs.  Questions were answered to the patient's satisfaction.     Tonatiuh Mallon    No change.  Twice daily pantoprazole has not made much difference in her reflux symptoms.  She is here today for an EGD with esophageal dilation as feasible/appropriate and first ever high risk screening colonoscopy  The risks, benefits, limitations, imponderables and alternatives regarding both EGD and colonoscopy have been reviewed with the patient. Questions have been answered. All parties agreeable.

## 2022-07-18 ENCOUNTER — Encounter: Payer: Self-pay | Admitting: Internal Medicine

## 2022-07-18 LAB — SURGICAL PATHOLOGY

## 2022-07-21 ENCOUNTER — Encounter (HOSPITAL_COMMUNITY): Payer: Self-pay | Admitting: Internal Medicine

## 2022-09-01 ENCOUNTER — Encounter: Payer: Self-pay | Admitting: Internal Medicine

## 2023-05-10 ENCOUNTER — Other Ambulatory Visit: Payer: Self-pay

## 2023-05-10 ENCOUNTER — Emergency Department (HOSPITAL_COMMUNITY): Payer: BC Managed Care – PPO

## 2023-05-10 ENCOUNTER — Encounter (HOSPITAL_COMMUNITY): Payer: Self-pay | Admitting: *Deleted

## 2023-05-10 ENCOUNTER — Emergency Department (HOSPITAL_COMMUNITY)
Admission: EM | Admit: 2023-05-10 | Discharge: 2023-05-10 | Disposition: A | Discharge status: Home / Self Care | Payer: Worker's Compensation | Attending: Emergency Medicine | Admitting: Emergency Medicine

## 2023-05-10 DIAGNOSIS — M79605 Pain in left leg: Secondary | ICD-10-CM | POA: Insufficient documentation

## 2023-05-10 MED ORDER — OXYCODONE-ACETAMINOPHEN 5-325 MG PO TABS
2.0000 | ORAL_TABLET | Freq: Once | ORAL | Status: AC
  Administered 2023-05-10: 2 via ORAL
  Filled 2023-05-10: qty 2

## 2023-05-10 NOTE — ED Provider Notes (Signed)
[place] EMERGENCY DEPARTMENT AT [facility] Provider Note   CSN: [id number] Arrival date & time: 05/10/23  1003     History Chief Complaint  Patient presents with   Leg Injury    Pamela Leonard is a 47 y.o. female patient who presents to the emergency department today with left leg pain that occurred after an accident at work 2 weeks ago.  Patient has already been seen and evaluated by the Workmen's Comp. doctor.  She was recently placed on clindamycin and Bactrim.  Patient here today because she was cleared by the Workmen's Comp. doctor and sent here for further evaluation.  Patient denies any fever, chills, new injury.  She does endorse associated lower leg swelling and increased level of pain.  She denies chest pain or shortness of breath.  She has not been taking any over-the-counter medications for pain.  HPI     Home Medications Prior to Admission medications   Medication Sig Start Date End Date Taking? Authorizing Provider  cetirizine (ZYRTEC) 10 MG tablet Take 10 mg by mouth in the morning.    [provider]  fluticasone (FLONASE) 50 MCG/ACT nasal spray Place 1 spray into both nostrils daily as needed for allergies. 04/11/22   [provider]  HYDROcodone-acetaminophen (NORCO) 7.5-325 MG tablet Take 1 tablet by mouth every 6 (six) hours as needed (pain.).    [provider]  Na Sulfate-K Sulfate-Mg Sulf 17.5-3.13-1.6 GM/177ML SOLN As directed 06/16/22   Rourk, Gerrit Friends, MD  pantoprazole (PROTONIX) 40 MG tablet Take 1 tablet (40 mg total) by mouth 2 (two) times daily before a meal. 06/16/22   Tiffany Kocher, PA-C      Allergies    Keflex [cephalexin], Other, and Prozac [fluoxetine hcl]    Review of Systems   Review of Systems  All other systems reviewed and are negative.   Physical Exam Updated Vital Signs BP (!) 148/90   Pulse 80   Temp 99.2 F (37.3 C)   Resp 16   Ht 5\' 1"  (1.549 m)   Wt 113.4 kg   SpO2 98%   BMI 47.24  kg/m  Physical Exam Vitals and nursing note reviewed.  Constitutional:      Appearance: Normal appearance.  HENT:     Head: Normocephalic and atraumatic.  Eyes:     General:        Right eye: No discharge.        Left eye: No discharge.     Conjunctiva/sclera: Conjunctivae normal.  Pulmonary:     Effort: Pulmonary effort is normal.  Musculoskeletal:     Comments: 2+ dorsalis pedis pulses felt bilaterally.  They are equal.  There is some mild lower leg swelling on the left.  Compartments are soft.  Skin:    General: Skin is warm and dry.     Findings: No rash.     Comments: Well-healing large wounds to the left shin.  There is surrounding erythema but area is not warm or has any evidence of purulence.  There is some also surrounding well-healing ecchymosis.  Neurological:     General: No focal deficit present.     Mental Status: She is alert.  Psychiatric:        Mood and Affect: Mood normal.        Behavior: Behavior normal.     ED Results / Procedures / Treatments   Labs (all labs ordered are listed, but only abnormal results are displayed) Labs Reviewed -  No data to display  EKG None  Radiology US Venous Img Lower Unilateral Left  Result Date: 05/10/2023 CLINICAL DATA:  Traumatic left leg injury, now with pain and edema. Evaluate for DVT. EXAM: LEFT LOWER EXTREMITY VENOUS DOPPLER ULTRASOUND TECHNIQUE: Gray-scale sonography with graded compression, as well as color Doppler and duplex ultrasound were performed to evaluate the lower extremity deep venous systems from the level of the common femoral vein and including the common femoral, femoral, profunda femoral, popliteal and calf veins including the posterior tibial, peroneal and gastrocnemius veins when visible. The superficial great saphenous vein was also interrogated. Spectral Doppler was utilized to evaluate flow at rest and with distal augmentation maneuvers in the common femoral, femoral and popliteal veins.  COMPARISON:  None Available. FINDINGS: Contralateral Common Femoral Vein: Respiratory phasicity is normal and symmetric with the symptomatic side. No evidence of thrombus. Normal compressibility. Common Femoral Vein: No evidence of thrombus. Normal compressibility, respiratory phasicity and response to augmentation. Saphenofemoral Junction: No evidence of thrombus. Normal compressibility and flow on color Doppler imaging. Profunda Femoral Vein: No evidence of thrombus. Normal compressibility and flow on color Doppler imaging. Femoral Vein: No evidence of thrombus. Normal compressibility, respiratory phasicity and response to augmentation. Popliteal Vein: No evidence of thrombus. Normal compressibility, respiratory phasicity and response to augmentation. Calf Veins: No evidence of thrombus. Normal compressibility and flow on color Doppler imaging. Superficial Great Saphenous Vein: No evidence of thrombus. Normal compressibility. Other Findings:  None. IMPRESSION: No evidence of DVT within the left lower extremity. Electronically Signed   By: Simonne Come M.D.   On: 05/10/2023 12:58    Procedures Procedures    Medications Ordered in ED Medications  oxyCODONE-acetaminophen (PERCOCET/ROXICET) 5-325 MG per tablet 2 tablet (2 tablets Oral Given 05/10/23 1137)    ED Course/ Medical Decision Making/ A&P Clinical Course as of 05/10/23 1311  Thu May 10, 2023  1303 US Venous Img Lower Unilateral Left I ordered and interpreted the study.  I do not see any evidence of DVT. [CF]    Clinical Course User Index [CF] Teressa Lower, PA-C   {   Click here for ABCD2, HEART and other calculators  Medical Decision Making Pamela Leonard is a 47 y.o. female patient who presents to the emergency department today for further evaluation of left lower leg pain.  I do feel that the wound is well-healing.  There is good granulation tissue and although there is some surrounding erythema the area is not warm to palpation.   There is no weeping or purulence draining from the wounds.  She does have some well-healing ecchymosis surrounding the leg.  Considering that the patient does have some general lower leg swelling on the left I will order a ultrasound of the left leg to evaluate for DVT.  Patient is currently covered on Bactrim and clindamycin.  We discussed over-the-counter analgesic NSAID medication to go home with. I will give her some narcotic pain medication here as she does appear very uncomfortable.  Patient overall is in no acute distress with normal vital signs.  DVT study is normal.  Will continue to treat conservatively with over-the-counter analgesic medications.  I will give her more work restrictions for the next week.  Patient will follow-up with her primary care doctor.  Amount and/or Complexity of Data Reviewed Radiology:  Decision-making details documented in ED Course.  Risk Prescription drug management.    Final Clinical Impression(s) / ED Diagnoses Final diagnoses:  Pain of left lower extremity  Rx / DC Orders ED Discharge Orders     None         Jolyn Lent 05/10/23 1311    Rondel Baton, MD 05/12/23 [phone]

## 2023-05-10 NOTE — ED Notes (Signed)
Wound care completed and leg wrapped with ace bandage.

## 2023-05-10 NOTE — ED Triage Notes (Signed)
Pt reports she fell 2 weeks ago and injured her left lower leg. Pt was placed on Clindamycin after the injury. Denies fever. Pt reports the pain is so bad she hasn't been able to eat or sleep well. Pt also reports her leg has been swollen and hot.

## 2023-05-10 NOTE — Discharge Instructions (Addendum)
As we discussed, you can take 600 mg of ibuprofen every 6 hours as needed for pain.  At the 3 to 4-hour mark you can stack on Tylenol.  The wound itself looks pretty good.  Keep taking the clindamycin and Bactrim like we discussed.  I will give you restricted activities at work.

## 2023-05-10 NOTE — ED Notes (Signed)
Pt left lower leg has swelling with redness and purple coloring.Pt states it was worse 3 days age when it happened. Pt states it has been draining. It is warm to the touch.

## 2023-09-05 ENCOUNTER — Ambulatory Visit: Admitting: Podiatry

## 2023-09-05 ENCOUNTER — Encounter: Payer: Self-pay | Admitting: Podiatry

## 2023-09-05 ENCOUNTER — Ambulatory Visit (INDEPENDENT_AMBULATORY_CARE_PROVIDER_SITE_OTHER)

## 2023-09-05 DIAGNOSIS — M7731 Calcaneal spur, right foot: Secondary | ICD-10-CM

## 2023-09-05 DIAGNOSIS — M722 Plantar fascial fibromatosis: Secondary | ICD-10-CM

## 2023-09-05 MED ORDER — TRIAMCINOLONE ACETONIDE 10 MG/ML IJ SUSP
10.0000 mg | Freq: Once | INTRAMUSCULAR | Status: AC
  Administered 2023-09-05: 10 mg via INTRA_ARTICULAR

## 2023-09-05 MED ORDER — DICLOFENAC SODIUM 75 MG PO TBEC: 75.0000 mg | DELAYED_RELEASE_TABLET | Freq: Two times a day (BID) | ORAL | 2 refills | Status: AC

## 2023-09-06 NOTE — Progress Notes (Signed)
 Subjective:   Patient ID: Pamela Leonard, female   DOB: 47 y.o.   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  Patient tries to be active does not smoke currently   Review of Systems  All other systems reviewed and are negative.       Objective:  Physical Exam Vitals and nursing note reviewed.  Constitutional:      Appearance: She is well-developed.  Pulmonary:     Effort: Pulmonary effort is normal.  Musculoskeletal:        General: Normal range of motion.  Skin:    General: Skin is warm.  Neurological:     Mental Status: She is alert.     Neurovascular status intact muscle strength was found to be adequate range of motion within normal limits with exquisite discomfort plantar aspect right heel at the insertional point tendon calcaneus fluid buildup around the area and moderate depression of the arch.  Good digital perfusion well-oriented x 3     Assessment:  Acute plantar fasciitis right inflammation fluid of the medial band with pain     Plan:  H&P reviewed condition and x-ray and today did sterile prep injected the plantar fascia heel 3 mg Kenalog 5 mg Xylocaine and applied fascial brace to lift up the arch with instructions on usage and shoe gear modifications physical therapy.  Reappoint to recheck again in the next several weeks  X-rays indicate bone spur formation no indication stress fracture arthritis

## 2023-10-03 ENCOUNTER — Encounter: Payer: Self-pay | Admitting: Podiatry

## 2023-10-03 ENCOUNTER — Ambulatory Visit: Admitting: Podiatry

## 2023-10-03 DIAGNOSIS — M722 Plantar fascial fibromatosis: Secondary | ICD-10-CM | POA: Diagnosis not present

## 2023-10-03 MED ORDER — TRIAMCINOLONE ACETONIDE 10 MG/ML IJ SUSP
10.0000 mg | Freq: Once | INTRAMUSCULAR | Status: AC
  Administered 2023-10-03: 10 mg via INTRA_ARTICULAR

## 2023-10-03 NOTE — Progress Notes (Signed)
 Subjective:   Patient ID: Pamela Leonard, female   DOB: 48 y.o.   MRN: [id number]   HPI Patient states she still has a point of pain in the right heel that improved but it still very sore with activity   ROS      Objective:  Physical Exam  Neuro vascular status intact with patient found to have 1 area of discomfort quite a bit in the right plantar heel     Assessment:  Plantar fasciitis right still present improvement but still very tender     Plan:  Reviewed condition sterile prep injected the plantar fascia right 1 more time 3 mg Kenalog 5 mg Xylocaine applied sterile dressing reappoint to recheck as needed

## 2024-03-12 ENCOUNTER — Ambulatory Visit (INDEPENDENT_AMBULATORY_CARE_PROVIDER_SITE_OTHER): Admitting: Podiatry

## 2024-03-12 ENCOUNTER — Encounter: Payer: Self-pay | Admitting: Podiatry

## 2024-03-12 DIAGNOSIS — M722 Plantar fascial fibromatosis: Secondary | ICD-10-CM

## 2024-03-13 NOTE — Progress Notes (Signed)
 Subjective:   Patient ID: Pamela Leonard, female   DOB: 48 y.o.   MRN: [id number]   HPI Patient presents with a lot of pain in the plantar proximal portion of the right heel stating it just started recently   ROS      Objective:  Physical Exam  Vascular status intact inflammation pain plantar fascia right and insertion     Assessment:  Chronic fasciitis right that did well for around 3 months with obesity is complication     Plan:  Sterile prep injected plantar proximal 3 mg Kenalog  5 mg Xylocaine  applied sterile dressing reappoint to recheck

## 2024-03-26 ENCOUNTER — Ambulatory Visit: Admitting: Podiatry

## 2024-05-14 ENCOUNTER — Encounter (INDEPENDENT_AMBULATORY_CARE_PROVIDER_SITE_OTHER): Payer: Self-pay
